# Patient Record
Sex: Female | Born: 1985 | Race: White | Hispanic: No | Smoking: Never smoker
Health system: Southern US, Community
[De-identification: ages and names within clinical notes are randomized; demographics above are authoritative.]

## PROBLEM LIST (undated history)

## (undated) ENCOUNTER — Inpatient Hospital Stay (HOSPITAL_COMMUNITY): Payer: Self-pay

## (undated) DIAGNOSIS — R5383 Other fatigue: Secondary | ICD-10-CM

## (undated) DIAGNOSIS — B009 Herpesviral infection, unspecified: Secondary | ICD-10-CM

## (undated) DIAGNOSIS — R519 Headache, unspecified: Secondary | ICD-10-CM

## (undated) DIAGNOSIS — F419 Anxiety disorder, unspecified: Secondary | ICD-10-CM

## (undated) DIAGNOSIS — J302 Other seasonal allergic rhinitis: Secondary | ICD-10-CM

## (undated) DIAGNOSIS — R51 Headache: Secondary | ICD-10-CM

## (undated) DIAGNOSIS — Z8619 Personal history of other infectious and parasitic diseases: Secondary | ICD-10-CM

## (undated) DIAGNOSIS — F329 Major depressive disorder, single episode, unspecified: Secondary | ICD-10-CM

## (undated) DIAGNOSIS — F32A Depression, unspecified: Secondary | ICD-10-CM

## (undated) DIAGNOSIS — G43909 Migraine, unspecified, not intractable, without status migrainosus: Secondary | ICD-10-CM

## (undated) DIAGNOSIS — E039 Hypothyroidism, unspecified: Secondary | ICD-10-CM

## (undated) HISTORY — DX: Anxiety disorder, unspecified: F41.9

## (undated) HISTORY — PX: UPPER GI ENDOSCOPY: SHX6162

## (undated) HISTORY — DX: Migraine, unspecified, not intractable, without status migrainosus: G43.909

## (undated) HISTORY — PX: EYE SURGERY: SHX253

## (undated) HISTORY — DX: Headache: R51

## (undated) HISTORY — DX: Headache, unspecified: R51.9

## (undated) HISTORY — DX: Other fatigue: R53.83

## (undated) HISTORY — PX: FLEXIBLE SIGMOIDOSCOPY: SHX1649

## (undated) HISTORY — DX: Personal history of other infectious and parasitic diseases: Z86.19

## (undated) HISTORY — DX: Major depressive disorder, single episode, unspecified: F32.9

## (undated) HISTORY — DX: Depression, unspecified: F32.A

---
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
## 2006-01-05 ENCOUNTER — Ambulatory Visit: Payer: Self-pay | Admitting: Family Medicine

## 2006-01-27 ENCOUNTER — Ambulatory Visit: Payer: Self-pay | Admitting: Sports Medicine

## 2006-03-02 ENCOUNTER — Ambulatory Visit: Payer: Self-pay | Admitting: Family Medicine

## 2006-03-19 ENCOUNTER — Ambulatory Visit: Payer: Self-pay | Admitting: Sports Medicine

## 2006-03-23 ENCOUNTER — Other Ambulatory Visit: Admission: RE | Admit: 2006-03-23 | Discharge: 2006-03-23 | Payer: Self-pay | Admitting: Gynecology

## 2006-06-17 ENCOUNTER — Ambulatory Visit: Payer: Self-pay | Admitting: Family Medicine

## 2007-03-10 DIAGNOSIS — IMO0001 Reserved for inherently not codable concepts without codable children: Secondary | ICD-10-CM

## 2007-04-22 ENCOUNTER — Encounter (INDEPENDENT_AMBULATORY_CARE_PROVIDER_SITE_OTHER): Payer: Self-pay | Admitting: *Deleted

## 2007-04-29 ENCOUNTER — Ambulatory Visit: Payer: Self-pay | Admitting: Family Medicine

## 2007-05-03 ENCOUNTER — Ambulatory Visit: Payer: Self-pay | Admitting: Internal Medicine

## 2007-05-14 ENCOUNTER — Ambulatory Visit: Payer: Self-pay | Admitting: Internal Medicine

## 2007-05-14 DIAGNOSIS — M1710 Unilateral primary osteoarthritis, unspecified knee: Secondary | ICD-10-CM | POA: Insufficient documentation

## 2007-05-14 LAB — CONVERTED CEMR LAB
Basophils Absolute: 0 10*3/uL (ref 0.0–0.1)
Eosinophils Absolute: 0.1 10*3/uL (ref 0.0–0.6)
MCHC: 34.8 g/dL (ref 30.0–36.0)
MCV: 86.6 fL (ref 78.0–100.0)
Monocytes Relative: 9.5 % (ref 3.0–11.0)
Neutro Abs: 5.8 10*3/uL (ref 1.4–7.7)
Platelets: 250 10*3/uL (ref 150–400)
RBC: 4.76 M/uL (ref 3.87–5.11)
TSH: 2.02 microintl units/mL (ref 0.35–5.50)
WBC: 9.1 10*3/uL (ref 4.5–10.5)

## 2007-06-14 ENCOUNTER — Ambulatory Visit: Payer: Self-pay | Admitting: Internal Medicine

## 2007-11-01 ENCOUNTER — Other Ambulatory Visit: Admission: RE | Admit: 2007-11-01 | Discharge: 2007-11-01 | Payer: Self-pay | Admitting: Obstetrics and Gynecology

## 2008-01-26 ENCOUNTER — Telehealth: Payer: Self-pay | Admitting: Internal Medicine

## 2008-01-26 ENCOUNTER — Ambulatory Visit: Payer: Self-pay | Admitting: Internal Medicine

## 2008-01-26 DIAGNOSIS — H1045 Other chronic allergic conjunctivitis: Secondary | ICD-10-CM | POA: Insufficient documentation

## 2008-04-20 ENCOUNTER — Telehealth: Payer: Self-pay | Admitting: Internal Medicine

## 2008-04-21 ENCOUNTER — Telehealth: Payer: Self-pay | Admitting: Internal Medicine

## 2008-05-01 ENCOUNTER — Ambulatory Visit: Payer: Self-pay | Admitting: Internal Medicine

## 2008-05-01 DIAGNOSIS — J0100 Acute maxillary sinusitis, unspecified: Secondary | ICD-10-CM

## 2008-05-01 DIAGNOSIS — R519 Headache, unspecified: Secondary | ICD-10-CM | POA: Insufficient documentation

## 2008-05-01 DIAGNOSIS — R51 Headache: Secondary | ICD-10-CM

## 2008-05-01 DIAGNOSIS — G43909 Migraine, unspecified, not intractable, without status migrainosus: Secondary | ICD-10-CM | POA: Insufficient documentation

## 2008-05-01 LAB — CONVERTED CEMR LAB
Basophils Absolute: 0.2 10*3/uL — ABNORMAL HIGH (ref 0.0–0.1)
Eosinophils Absolute: 0.1 10*3/uL (ref 0.0–0.7)
Eosinophils Relative: 1.7 % (ref 0.0–5.0)
Hemoglobin: 14.5 g/dL (ref 12.0–15.0)
Lymphocytes Relative: 35.7 % (ref 12.0–46.0)
Monocytes Absolute: 0.5 10*3/uL (ref 0.1–1.0)
Monocytes Relative: 8.2 % (ref 3.0–12.0)
Neutro Abs: 3.4 10*3/uL (ref 1.4–7.7)
WBC: 6.6 10*3/uL (ref 4.5–10.5)

## 2008-05-02 ENCOUNTER — Telehealth: Payer: Self-pay | Admitting: Internal Medicine

## 2008-05-08 ENCOUNTER — Telehealth: Payer: Self-pay | Admitting: Internal Medicine

## 2008-05-09 ENCOUNTER — Telehealth: Payer: Self-pay | Admitting: Internal Medicine

## 2008-05-11 ENCOUNTER — Ambulatory Visit: Payer: Self-pay | Admitting: Internal Medicine

## 2008-05-12 ENCOUNTER — Telehealth: Payer: Self-pay | Admitting: Internal Medicine

## 2008-05-12 ENCOUNTER — Emergency Department (HOSPITAL_COMMUNITY): Admission: EM | Admit: 2008-05-12 | Discharge: 2008-05-12 | Payer: Self-pay | Admitting: *Deleted

## 2008-05-16 ENCOUNTER — Telehealth: Payer: Self-pay | Admitting: Internal Medicine

## 2008-05-19 ENCOUNTER — Telehealth: Payer: Self-pay | Admitting: Internal Medicine

## 2008-05-31 ENCOUNTER — Encounter: Payer: Self-pay | Admitting: Internal Medicine

## 2008-06-07 ENCOUNTER — Ambulatory Visit: Payer: Self-pay | Admitting: Obstetrics and Gynecology

## 2008-06-19 ENCOUNTER — Ambulatory Visit: Payer: Self-pay | Admitting: Obstetrics and Gynecology

## 2008-07-27 ENCOUNTER — Telehealth: Payer: Self-pay | Admitting: Internal Medicine

## 2008-08-11 ENCOUNTER — Ambulatory Visit: Payer: Self-pay | Admitting: Women's Health

## 2008-10-19 ENCOUNTER — Ambulatory Visit: Payer: Self-pay | Admitting: Internal Medicine

## 2008-10-19 DIAGNOSIS — K5289 Other specified noninfective gastroenteritis and colitis: Secondary | ICD-10-CM | POA: Insufficient documentation

## 2008-11-13 ENCOUNTER — Telehealth: Payer: Self-pay | Admitting: Internal Medicine

## 2008-11-21 ENCOUNTER — Encounter: Payer: Self-pay | Admitting: Obstetrics and Gynecology

## 2008-11-21 ENCOUNTER — Ambulatory Visit: Payer: Self-pay | Admitting: Obstetrics and Gynecology

## 2008-11-21 ENCOUNTER — Other Ambulatory Visit: Admission: RE | Admit: 2008-11-21 | Discharge: 2008-11-21 | Payer: Self-pay | Admitting: Obstetrics and Gynecology

## 2008-11-23 ENCOUNTER — Inpatient Hospital Stay: Payer: Self-pay | Admitting: Psychiatry

## 2009-03-09 ENCOUNTER — Telehealth: Payer: Self-pay | Admitting: Internal Medicine

## 2009-03-09 ENCOUNTER — Ambulatory Visit: Payer: Self-pay | Admitting: Internal Medicine

## 2009-03-09 DIAGNOSIS — L255 Unspecified contact dermatitis due to plants, except food: Secondary | ICD-10-CM

## 2009-05-17 ENCOUNTER — Ambulatory Visit: Payer: Self-pay | Admitting: Obstetrics and Gynecology

## 2009-05-24 ENCOUNTER — Encounter: Admission: RE | Admit: 2009-05-24 | Discharge: 2009-05-24 | Payer: Self-pay | Admitting: Obstetrics and Gynecology

## 2009-05-24 ENCOUNTER — Ambulatory Visit: Payer: Self-pay | Admitting: Gynecology

## 2010-02-22 ENCOUNTER — Ambulatory Visit: Payer: Self-pay | Admitting: Obstetrics and Gynecology

## 2010-02-28 IMAGING — CR DG KNEE COMPLETE 4+V*L*
5 series · 5 of 5 positions shown · non-contrast
Comparison: None.

CLINICAL DATA: 21-year-old female knee pain

LEFT KNEE - COMPLETE 4+ VIEW

[t knee ap left]
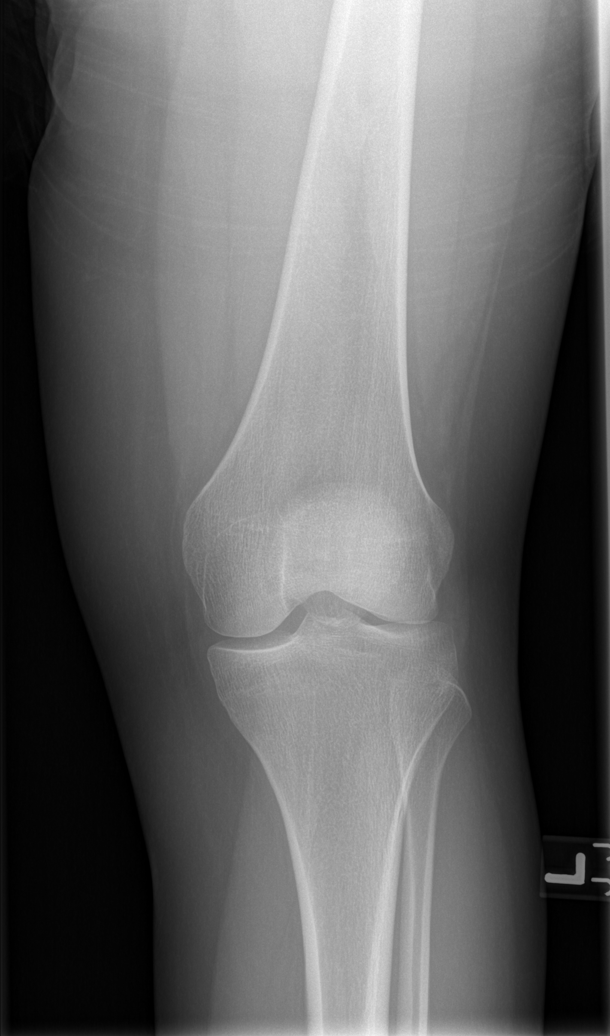

[t knee oblique left (1 of 2)]
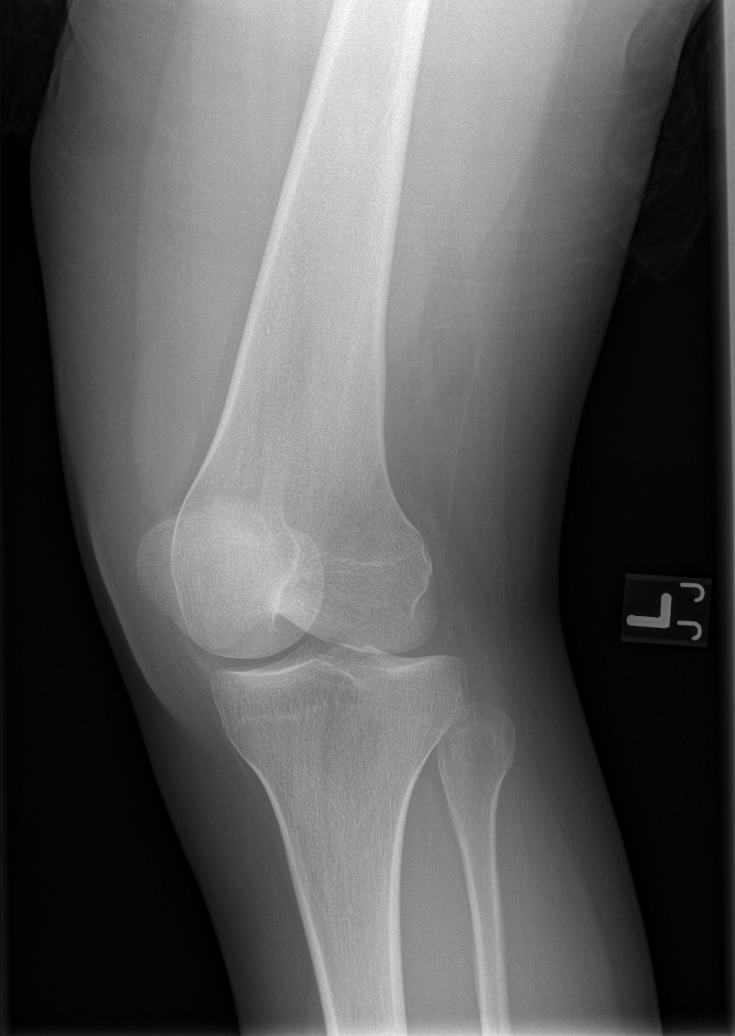

[t knee oblique left (2 of 2)]
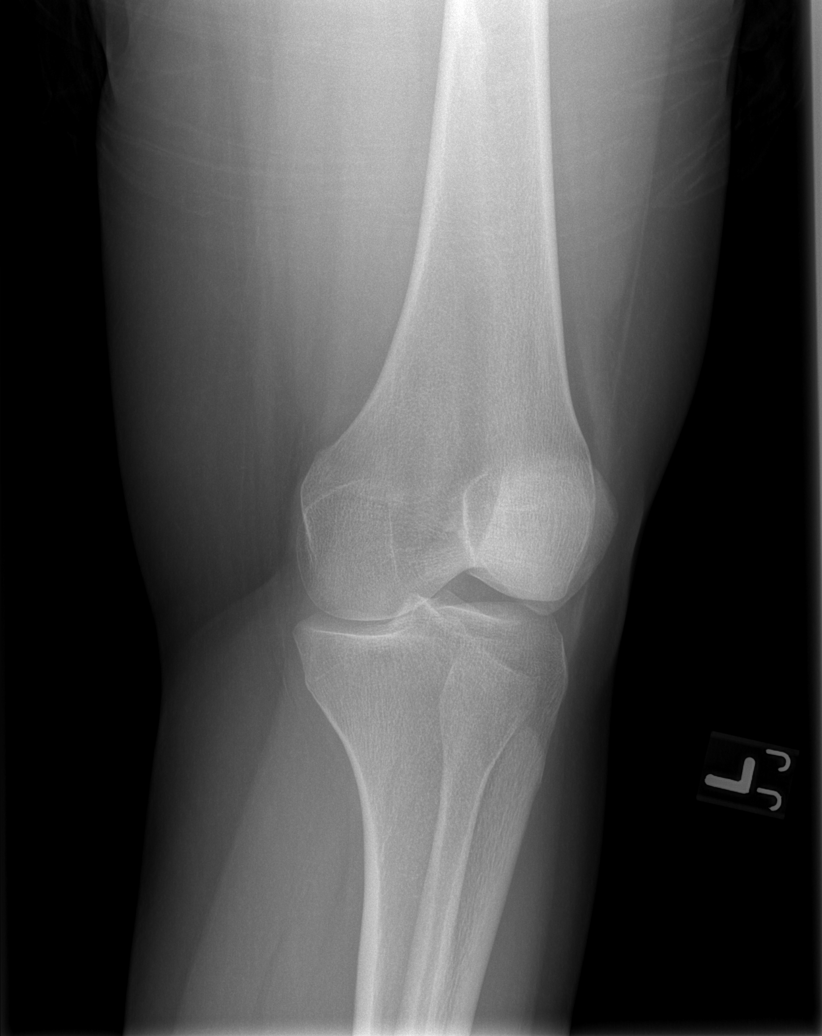

[t knee lat left (1 of 2)]
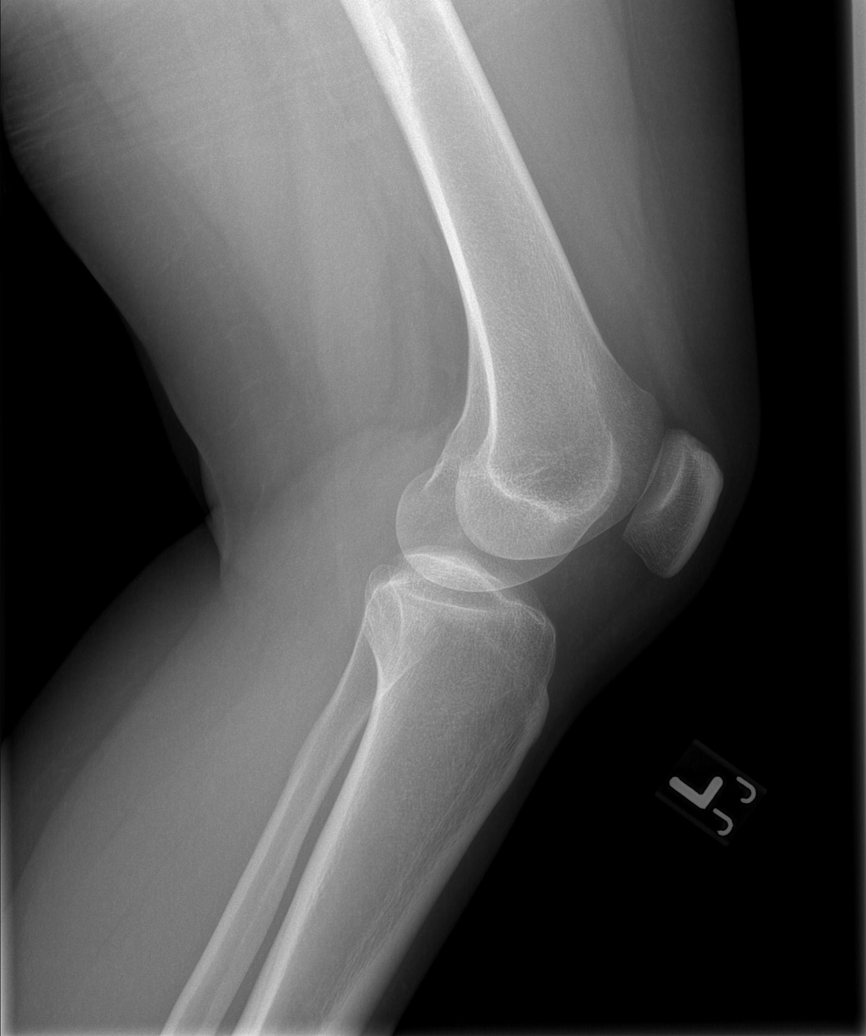

[t knee lat left (2 of 2)]
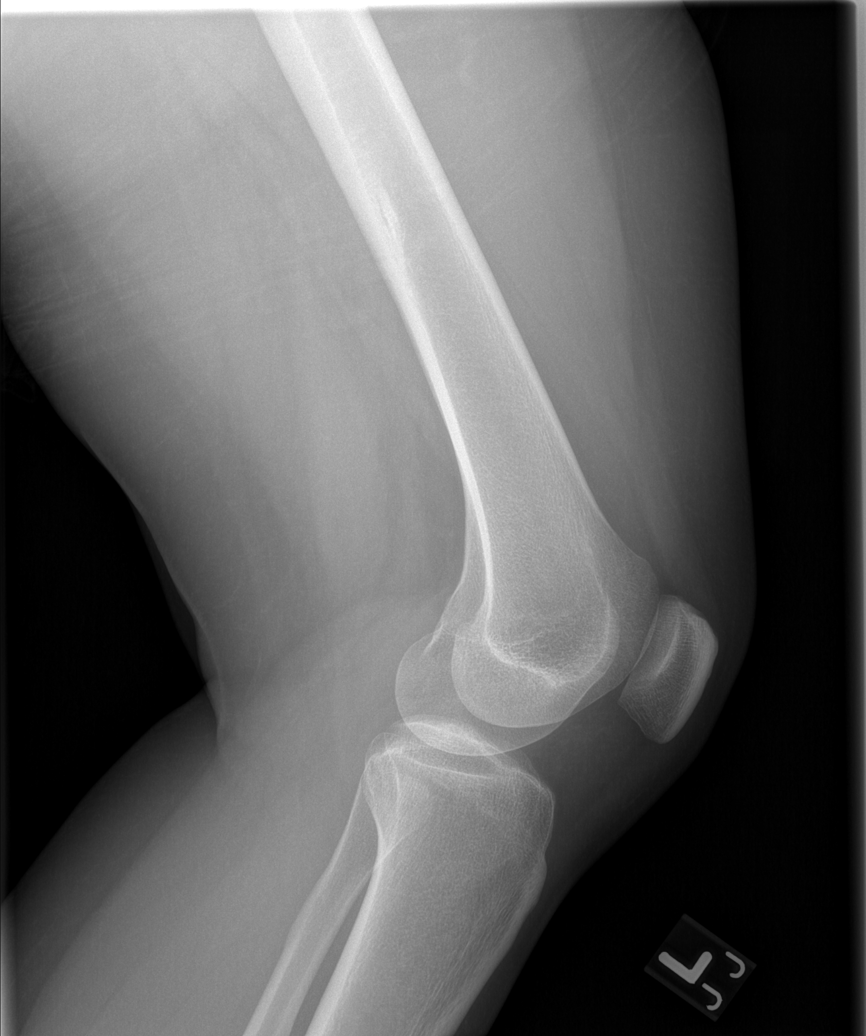

[5 of 5 positions shown; findings below may reference images not displayed]

FINDINGS: Normal alignment without fracture or joint effusion.
Preserved joint spaces.  No significant arthritic change.
IMPRESSION: No acute finding.

## 2010-08-27 ENCOUNTER — Other Ambulatory Visit: Admission: RE | Admit: 2010-08-27 | Discharge: 2010-08-27 | Payer: Self-pay | Admitting: Obstetrics and Gynecology

## 2010-08-27 ENCOUNTER — Ambulatory Visit: Payer: Self-pay | Admitting: Obstetrics and Gynecology

## 2011-06-27 LAB — CBC
MCHC: 33.5
Platelets: 245
RDW: 11.9

## 2011-06-27 LAB — DIFFERENTIAL
Basophils Relative: 1
Eosinophils Absolute: 0.1
Lymphocytes Relative: 28
Lymphs Abs: 2.1
Monocytes Relative: 4
Neutrophils Relative %: 66

## 2011-06-27 LAB — COMPREHENSIVE METABOLIC PANEL
BUN: 10
Creatinine, Ser: 0.85
Glucose, Bld: 89
Potassium: 4.1
Sodium: 138

## 2011-06-27 LAB — URINALYSIS, ROUTINE W REFLEX MICROSCOPIC
Bilirubin Urine: NEGATIVE
Hgb urine dipstick: NEGATIVE
Nitrite: NEGATIVE
Protein, ur: NEGATIVE
Specific Gravity, Urine: 1.015
Urobilinogen, UA: 0.2

## 2011-06-27 LAB — URINE CULTURE

## 2011-06-27 LAB — PROTIME-INR: Prothrombin Time: 12.9

## 2011-06-27 LAB — B. BURGDORFI ANTIBODIES: B burgdorferi Ab IgG+IgM: 0.16

## 2011-06-27 LAB — WET PREP, GENITAL

## 2011-06-27 LAB — APTT: aPTT: 21 — ABNORMAL LOW

## 2011-06-27 LAB — LYME DISEASE DNA BY PCR(BORRELIA BURG)

## 2011-06-27 LAB — GC/CHLAMYDIA PROBE AMP, GENITAL: Chlamydia, DNA Probe: NEGATIVE

## 2011-06-27 LAB — ROCKY MTN SPOTTED FVR AB, IGG-BLOOD: RMSF IgG: 0.56 IV

## 2011-06-27 LAB — SEDIMENTATION RATE: Sed Rate: 4

## 2011-06-27 LAB — RPR: RPR Ser Ql: NONREACTIVE

## 2011-07-07 ENCOUNTER — Ambulatory Visit (INDEPENDENT_AMBULATORY_CARE_PROVIDER_SITE_OTHER): Payer: 59 | Admitting: Family Medicine

## 2011-07-07 ENCOUNTER — Encounter: Payer: Self-pay | Admitting: Family Medicine

## 2011-07-07 VITALS — BP 104/68 | HR 73 | Temp 98.0°F | Wt 152.0 lb

## 2011-07-07 DIAGNOSIS — R42 Dizziness and giddiness: Secondary | ICD-10-CM

## 2011-07-07 DIAGNOSIS — R51 Headache: Secondary | ICD-10-CM

## 2011-07-07 LAB — CBC WITH DIFFERENTIAL/PLATELET
Basophils Absolute: 0 10*3/uL (ref 0.0–0.1)
Eosinophils Absolute: 0.1 10*3/uL (ref 0.0–0.7)
HCT: 45 % (ref 36.0–46.0)
Hemoglobin: 15.2 g/dL — ABNORMAL HIGH (ref 12.0–15.0)
Lymphs Abs: 2 10*3/uL (ref 0.7–4.0)
MCHC: 33.8 g/dL (ref 30.0–36.0)
Monocytes Absolute: 0.5 10*3/uL (ref 0.1–1.0)
Neutro Abs: 4 10*3/uL (ref 1.4–7.7)
Platelets: 210 10*3/uL (ref 150.0–400.0)
RDW: 13 % (ref 11.5–14.6)

## 2011-07-07 LAB — HEPATIC FUNCTION PANEL
ALT: 11 U/L (ref 0–35)
AST: 14 U/L (ref 0–37)
Bilirubin, Direct: 0 mg/dL (ref 0.0–0.3)
Total Bilirubin: 0.7 mg/dL (ref 0.3–1.2)
Total Protein: 7.9 g/dL (ref 6.0–8.3)

## 2011-07-07 LAB — POCT URINALYSIS DIPSTICK
Ketones, UA: NEGATIVE
Protein, UA: NEGATIVE
Spec Grav, UA: 1.015
Urobilinogen, UA: 0.2
pH, UA: 6.5

## 2011-07-07 LAB — BASIC METABOLIC PANEL
BUN: 12 mg/dL (ref 6–23)
CO2: 29 mEq/L (ref 19–32)
Chloride: 103 mEq/L (ref 96–112)
Creatinine, Ser: 0.9 mg/dL (ref 0.4–1.2)
Potassium: 4.9 mEq/L (ref 3.5–5.1)

## 2011-07-07 MED ORDER — SUMATRIPTAN SUCCINATE 100 MG PO TABS: 100.0000 mg | ORAL_TABLET | Freq: Once | ORAL | Status: DC | PRN

## 2011-07-07 MED ORDER — CYCLOBENZAPRINE HCL 10 MG PO TABS: 10.0000 mg | ORAL_TABLET | Freq: Three times a day (TID) | ORAL | Status: DC | PRN

## 2011-07-07 NOTE — Progress Notes (Signed)
  Subjective:    Patient ID: Ariana Spencer, female    DOB: December 11, 1985, 25 y.o.   MRN: [id number]  HPI Here with mother for several problems. First she has a hx of migraines, and these have been more frequent for several months. These are classic throbbing HAs with nausea. She usually takes Motrin for these. She has never tried a triptan. Also for 2 weeks she has had frequet spells of dizziness which she describes as the room spinning when she moves suddenly. Also she has a long hx of neck stiffness after an accident years ago, and this often makes her sleep difficult.    Review of Systems  Constitutional: Negative.   Respiratory: Negative.   Cardiovascular: Negative.   Neurological: Positive for dizziness.       Objective:   Physical Exam  Constitutional: She is oriented to person, place, and time. She appears well-developed and well-nourished.  Neck: Neck supple. No thyromegaly present.  Cardiovascular: Normal rate, regular rhythm, normal heart sounds and intact distal pulses.   Pulmonary/Chest: Effort normal and breath sounds normal.  Lymphadenopathy:    She has no cervical adenopathy.  Neurological: She is alert and oriented to person, place, and time. She has normal reflexes. No cranial nerve deficit. She exhibits normal muscle tone. Coordination normal.          Assessment & Plan:  She seems to have a mixture of tension HAs and migraines. For the migraines, try Sumatriptan. Her dizziness seems to be labyrinthine in origin, so she will try Claritin D. Get labs today

## 2011-07-10 ENCOUNTER — Telehealth: Payer: Self-pay | Admitting: Family Medicine

## 2011-07-10 NOTE — Telephone Encounter (Signed)
Spoke with pt and gave results. 

## 2011-07-10 NOTE — Telephone Encounter (Signed)
Message copied by Baldemar Friday on Thu Jul 10, 2011  4:02 PM ------      Message from: Gershon Crane A      Created: Tue Jul 08, 2011  6:00 AM       normal

## 2011-07-14 ENCOUNTER — Ambulatory Visit (INDEPENDENT_AMBULATORY_CARE_PROVIDER_SITE_OTHER): Payer: 59 | Admitting: Obstetrics and Gynecology

## 2011-07-14 ENCOUNTER — Encounter: Payer: Self-pay | Admitting: Obstetrics and Gynecology

## 2011-07-14 DIAGNOSIS — N644 Mastodynia: Secondary | ICD-10-CM

## 2011-07-14 DIAGNOSIS — B029 Zoster without complications: Secondary | ICD-10-CM | POA: Insufficient documentation

## 2011-07-14 NOTE — Progress Notes (Signed)
Patient came to see me today with a 7 day history of right breast pain and swelling. She notices significant asymmetry with the right breast much larger than her left breast. She says most of the discomfort is central.  Breast exam: Kennon Portela present. The patient's breasts were examined the sitting position. Her right breast is significantly larger than the left breast. There is a suggestion of a lump but I cannot actually define one. She is status post augmentation. She was then examined lying down. No dominant lesion can be felt in this position at all. She is very tender at her nipple.  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  Plan: Ultrasound of right breast with possible mammography. Vitamin D 400 mg daily. Continue to reduce caffeine. If ultrasound negative and tenderness persists she will call for danazol.

## 2011-07-16 ENCOUNTER — Telehealth: Payer: Self-pay | Admitting: *Deleted

## 2011-07-16 ENCOUNTER — Other Ambulatory Visit: Payer: Self-pay | Admitting: *Deleted

## 2011-07-16 DIAGNOSIS — N644 Mastodynia: Secondary | ICD-10-CM

## 2011-07-16 NOTE — Telephone Encounter (Signed)
Message copied by Libby Maw on Wed Jul 16, 2011  9:29 AM ------      Message from: Trellis Paganini      Created: Mon Jul 14, 2011 12:45 PM       Patient has swelling and pain in her right breast. Please schedule her for ultrasound of her right breast. Also schedule diagnostic mammogram right breast pending ultrasound. [facility]

## 2011-07-16 NOTE — Telephone Encounter (Signed)
Patient informed appointment set at the [facility] [place] on 07/21/11 @9 :30am.

## 2011-07-21 ENCOUNTER — Ambulatory Visit
Admission: RE | Admit: 2011-07-21 | Discharge: 2011-07-21 | Disposition: A | Discharge status: Home / Self Care | Payer: 59 | Source: Ambulatory Visit | Attending: Obstetrics and Gynecology | Admitting: Obstetrics and Gynecology

## 2011-07-21 ENCOUNTER — Telehealth: Payer: Self-pay | Admitting: *Deleted

## 2011-07-21 DIAGNOSIS — N644 Mastodynia: Secondary | ICD-10-CM

## 2011-07-21 NOTE — Telephone Encounter (Signed)
I think we should have her see one of the surgeons in [facility]. You can get her an appoinment with anyone. My favorites are Trixie Rude or [employer].

## 2011-07-21 NOTE — Telephone Encounter (Signed)
Patient's mother called today saying they had the right breast ultrasound done today and was neg.  Mother states she still has bumps that are visible and is in a lot of pain.  What else can be done at this point?

## 2011-07-22 NOTE — Telephone Encounter (Signed)
Lm to call

## 2011-07-23 NOTE — Telephone Encounter (Signed)
Patient's mother informed appointment set up with Dr. Ezzard Standing tomorrow at 3pm.

## 2011-07-24 ENCOUNTER — Ambulatory Visit (INDEPENDENT_AMBULATORY_CARE_PROVIDER_SITE_OTHER): Payer: 59 | Admitting: Surgery

## 2011-07-24 ENCOUNTER — Encounter (INDEPENDENT_AMBULATORY_CARE_PROVIDER_SITE_OTHER): Payer: Self-pay | Admitting: Surgery

## 2011-07-24 VITALS — BP 122/78 | HR 60 | Temp 98.1°F | Resp 20 | Ht 63.0 in | Wt 157.0 lb

## 2011-07-24 DIAGNOSIS — N644 Mastodynia: Secondary | ICD-10-CM

## 2011-07-24 NOTE — Progress Notes (Signed)
ASSESSMENT AND PLAN: 1.  Right breast mass/pain.  Non specific, under and around areola.  I do not think the pain is related to the implants.  I wonder whether this has something to do with the shingles she has had.  I can find no primary breast problem.  She could have some fibrocystic disease of the breast, but her [place] of her breast is not impressive.  I gave her literature on breast disease.  Suggested she see Dr. Shon Hough.  If shingles is a question, review this with Dr. Lovell Sheehan.  She can take NSAIDs or tylenol for the pain.  She is already taking ibuprofen.  See me back on a PRN basis.  2.  Bilateral implants,  [place], 2007  3.  Vertigo.  Coincides with breast trouble.  Etiology unknown.  4.  Shingles, right back, history of.  Chief Complaint  Patient presents with  . New Evaluation    painful knot in right breast    REFERRING PHYSICIAN: Dr. April Holding.  HISTORY OF PRESENT ILLNESS: Ariana Spencer is a 25 y.o. (DOB: Aug 21, 1986)  white female whose primary care physician is Carrie Mew, MD and comes to me today for right breast mass.  The patient developed soreness and swelling in her right breast about 3 weeks ago. She had a dull throbbing pain. She noticed a change in the size of her breast while in the shower. She has had no fever, no redness to her breasts, or no nipple discharge.  She had a negative [place] of her right breast [facility] the [facility] on [facility]/21/2011.  She did have bilateral breast implants by Dr. Louisa Second in 2007. She has been happy with her implants and has had no problems.  About the same time she developed the breast pain, she developed vertigo. She describes it as" morning sickness", but she is not pregnant.  Also in the mix, she has history of shingles to her right back. She has had some tingling sensation in the area of her old shingles.  Past Medical History  Diagnosis Date  . Herpes zoster 05/24/09  . IUD 06/19/08    HAS MIRENA IN  PLACE/INSERTED 06/19/08  . Nausea and vomiting   . Migraines   . Generalized headaches   . Fatigue     Past Surgical History  Procedure Date  . Augmentation mammaplasty 2007  . Wisdom tooth extraction 2007    Current Outpatient Prescriptions  Medication Sig Dispense Refill  . IBUPROFEN PO Take by mouth daily.          No Known Allergies  REVIEW OF SYSTEMS: Skin:  No history of rash.  No history of abnormal moles. Infection:  HIstory of shingles, right back.. Neurologic:  No history of headaches. Has vertigo that started about when this breast pain began - 3 weeks ago.  It is better, but still there. Cardiac:  No history of hypertension. No history of heart disease.   Pulmonary:  Does not smoke cigarettes.  No asthma or bronchitis.    Endocrine:  No diabetes. No thyroid disease. Gastrointestinal:  No history of stomach disease.  No history of liver disease.  No history of colon disease. Urologic:  No history of kidney stones.  No history of bladder infections. Musculoskeletal:  No history of joint or back disease. Hematologic:  No bleeding disorder.  Psycho-social:  The patient is oriented.   The patient has no obvious psychologic or social impairment to understanding our conversation and plan.  SOCIAL and  FAMILY HISTORY: Single, works as Film/video editor, Information systems manager.  Also coaches cheerleaders. Accompanied with grandmother and mother.  PHYSICAL EXAM: BP 122/78  Pulse 60  Temp(Src) 98.1 F (36.7 C) (Temporal)  Resp 20  Ht 5\' 3"  (1.6 m)  Wt 157 lb (71.215 kg)  BMI 27.81 kg/m2  General: WN WF, young,  who is alert and generally healthy appearing.  HEENT: Normal. Pupils equal. Good dentition. Neck: Supple. No mass.  No thyroid mass.  Carotid pulse okay with no bruit. Lymph Nodes:  No supraclavicular or cervical nodes. Lungs: Clear to auscultation and symmetric breath sounds. Heart:  RRR. No murmur or rub. Breasts:  Tender under right areola, but I feel no mass.  She has no  nipple discharge.  She points to areas on the right breast where she feels a mass at 3 o'clock and 7 o'clock about 2-3 cm off areola, but I fell no suspicious mass in those areas.  Inframammary scar from implants bilaterally.  No rash  Abdomen: Soft. Umbilical ring  No abdominal scars. Extremities:  Good strength and ROM  in upper and lower extremities. Neurologic:  Grossly intact to motor and sensory function. Psychiatric: Has normal mood and affect. Behavior is normal.   DATA REVIEWED: [place] report from breast center - 07/21/2011. I did an office [place].  I saw no mass or nodule.  The implant lining looks normal.  Her breast thickness is between 1.2 - 1.4 cm.  Ovidio Kin, M.D., [facility], [place] [phone]

## 2011-07-24 NOTE — Patient Instructions (Signed)
1.  See Dr. Shon Hough for eval.  2.  Anti-inflammatory drugs may help.

## 2011-10-03 ENCOUNTER — Emergency Department (HOSPITAL_COMMUNITY): Payer: 59

## 2011-10-03 ENCOUNTER — Emergency Department (HOSPITAL_COMMUNITY)
Admission: EM | Admit: 2011-10-03 | Discharge: 2011-10-03 | Disposition: A | Discharge status: Home / Self Care | Payer: 59 | Attending: Emergency Medicine | Admitting: Emergency Medicine

## 2011-10-03 ENCOUNTER — Telehealth: Payer: Self-pay | Admitting: *Deleted

## 2011-10-03 ENCOUNTER — Encounter (HOSPITAL_COMMUNITY): Payer: Self-pay

## 2011-10-03 ENCOUNTER — Other Ambulatory Visit: Payer: Self-pay

## 2011-10-03 DIAGNOSIS — N949 Unspecified condition associated with female genital organs and menstrual cycle: Secondary | ICD-10-CM | POA: Insufficient documentation

## 2011-10-03 DIAGNOSIS — R0602 Shortness of breath: Secondary | ICD-10-CM | POA: Insufficient documentation

## 2011-10-03 DIAGNOSIS — R109 Unspecified abdominal pain: Secondary | ICD-10-CM | POA: Insufficient documentation

## 2011-10-03 DIAGNOSIS — R10819 Abdominal tenderness, unspecified site: Secondary | ICD-10-CM | POA: Insufficient documentation

## 2011-10-03 LAB — CBC
HCT: 44 % (ref 36.0–46.0)
MCHC: 34.5 g/dL (ref 30.0–36.0)
Platelets: 214 10*3/uL (ref 150–400)
RDW: 12.2 % (ref 11.5–15.5)

## 2011-10-03 LAB — URINALYSIS, ROUTINE W REFLEX MICROSCOPIC
Leukocytes, UA: NEGATIVE
Nitrite: NEGATIVE
Protein, ur: NEGATIVE mg/dL
Urobilinogen, UA: 0.2 mg/dL (ref 0.0–1.0)

## 2011-10-03 LAB — D-DIMER, QUANTITATIVE: D-Dimer, Quant: <0.22 ug/mL-FEU (ref 0.00–0.48)

## 2011-10-03 LAB — BASIC METABOLIC PANEL
Chloride: 95 mEq/L — ABNORMAL LOW (ref 96–112)
Creatinine, Ser: 0.74 mg/dL (ref 0.50–1.10)
GFR calc Af Amer: >90 mL/min (ref >90)
Sodium: 134 mEq/L — ABNORMAL LOW (ref 135–145)

## 2011-10-03 LAB — DIFFERENTIAL
Basophils Absolute: 0 10*3/uL (ref 0.0–0.1)
Basophils Relative: 0 % (ref 0–1)
Monocytes Absolute: 0.7 10*3/uL (ref 0.1–1.0)
Neutro Abs: 7.5 10*3/uL (ref 1.7–7.7)
Neutrophils Relative %: 72 % (ref 43–77)

## 2011-10-03 LAB — GLUCOSE, CAPILLARY: Glucose-Capillary: 60 mg/dL — ABNORMAL LOW (ref 70–99)

## 2011-10-03 MED ORDER — IOHEXOL 300 MG/ML  SOLN
20.0000 mL | INTRAMUSCULAR | Status: AC
  Administered 2011-10-03: 20 mL via ORAL

## 2011-10-03 MED ORDER — SODIUM CHLORIDE 0.9 % IV BOLUS (SEPSIS)
1000.0000 mL | Freq: Once | INTRAVENOUS | Status: AC
  Administered 2011-10-03: 1000 mL via INTRAVENOUS

## 2011-10-03 MED ORDER — MORPHINE SULFATE 4 MG/ML IJ SOLN
4.0000 mg | Freq: Once | INTRAMUSCULAR | Status: AC
  Administered 2011-10-03: 4 mg via INTRAVENOUS
  Filled 2011-10-03: qty 1

## 2011-10-03 MED ORDER — PROMETHAZINE HCL 25 MG PO TABS: 25.0000 mg | ORAL_TABLET | Freq: Four times a day (QID) | ORAL | Status: AC | PRN

## 2011-10-03 MED ORDER — OXYCODONE-ACETAMINOPHEN 5-325 MG PO TABS: 1.0000 | ORAL_TABLET | ORAL | Status: AC | PRN

## 2011-10-03 MED ORDER — IOHEXOL 300 MG/ML  SOLN
80.0000 mL | Freq: Once | INTRAMUSCULAR | Status: AC | PRN
  Administered 2011-10-03: 80 mL via INTRAVENOUS

## 2011-10-03 NOTE — ED Provider Notes (Signed)
Medical screening examination/treatment/procedure(s) were conducted as a shared visit with non-physician practitioner(s) and myself.  I personally evaluated the patient during the encounter 26 year old, female, with no significant past medical history was at work doing paperwork when she had sudden onset of abdominal pain with mild difficulty breathing.  She has never had this before.  The abdominal pain radiates up to her left shoulder.  She denies a history of recent fevers, sore throat, runny nose, or rash.  She has had ovarian cysts in the past, but no endometriosis.  She has an IUD.  On examination.  She has very shallow gasping respirations, but is not in distress.  She has diffuse abdominal tenderness, most prominently in the epigastrium and left upper quadrant.  She does not have any guarding.  Bedside fast exam does not show any free fluid.  We will perform a chest x-ray looking for free air, as well as a d-dimer for pulmonary embolism, which I doubt.  We'll also perform a CAT scan of her abdomen.  Since that is where the predominance of her symptoms are  Nicholes Stairs, MD 10/03/11 [phone]

## 2011-10-03 NOTE — ED Notes (Signed)
Notified CT that pt is finished with po contrast  

## 2011-10-03 NOTE — ED Provider Notes (Signed)
History     CSN: [id number]  Arrival date & time 10/03/11  1356   First MD Initiated Contact with Patient 10/03/11 1708      Chief Complaint  Patient presents with  . Abdominal Pain   HPI Patient was seen at 1715 with Dr. Weldon Inches.  Patient presents to the emergency room with complaint of sudden onset periumbilical/epigastric abdominal pain that started this morning while she was sitting at her desk at work. Patient denies any nausea. Reports associated shortness of breath. Denies any trauma or injury. Denies any recent travel or surgeries. Denies any other concerns at this time.   Past Medical History  Diagnosis Date  . Herpes zoster 05/24/09  . IUD 06/19/08    HAS MIRENA IN PLACE/INSERTED 06/19/08  . Nausea and vomiting   . Migraines   . Generalized headaches   . Fatigue     Past Surgical History  Procedure Date  . Augmentation mammaplasty 2007  . Wisdom tooth extraction 2007    Family History  Problem Relation Age of Onset  . Hypertension Father   . Cancer Maternal Grandmother     ovarian or uterine cancer and throat cancer  . Breast cancer Maternal Grandmother 29  . Cancer Paternal Grandfather     lung  . Lymphoma Paternal Grandfather     History  Substance Use Topics  . Smoking status: Never Smoker   . Smokeless tobacco: Never Used  . Alcohol Use: 0.0 oz/week    1-2 Glasses of wine per week     occas    OB History    Grav Para Term Preterm Abortions TAB SAB Ect Mult Living   0               Review of Systems  Constitutional: Negative for fever, chills, diaphoresis, activity change, appetite change, fatigue and unexpected weight change.  HENT: Negative for hearing loss, ear pain, nosebleeds, congestion, sore throat, trouble swallowing, neck pain, neck stiffness, voice change, tinnitus and ear discharge.   Eyes: Negative for photophobia and visual disturbance.  Respiratory: Positive for shortness of breath. Negative for apnea, cough, choking, chest  tightness, wheezing and stridor.   Cardiovascular: Negative for chest pain.  Gastrointestinal: Positive for abdominal pain. Negative for nausea and vomiting.  Genitourinary: Negative for dysuria, flank pain, decreased urine volume, vaginal bleeding, vaginal discharge, difficulty urinating and vaginal pain.  Musculoskeletal: Negative for back pain.  Skin: Negative for rash.  Neurological: Negative for dizziness, facial asymmetry, weakness, light-headedness, numbness and headaches.  Psychiatric/Behavioral: Negative for behavioral problems and agitation.  All other systems reviewed and are negative.    Allergies  Review of patient's allergies indicates no known allergies.  Home Medications   Current Outpatient Rx  Name Route Sig Dispense Refill  . IBUPROFEN 200 MG PO TABS Oral Take 400 mg by mouth every 6 (six) hours as needed. For pain       BP 103/82  Pulse 83  Temp(Src) 98 F (36.7 C) (Oral)  Resp 25  SpO2 100%  Physical Exam  Nursing note and vitals reviewed. Constitutional: She is oriented to person, place, and time. She appears well-developed and well-nourished.  Non-toxic appearance. She appears distressed.       Vital signs stable  HENT:  Head: Normocephalic and atraumatic.  Right Ear: External ear normal.  Left Ear: External ear normal.  Mouth/Throat: No oropharyngeal exudate.  Eyes: EOM are normal. Pupils are equal, round, and reactive to light. Right eye exhibits no discharge. Left  eye exhibits no discharge.  Neck: Normal range of motion. Neck supple. No JVD present. No tracheal deviation present.  Cardiovascular: Normal rate, regular rhythm, normal heart sounds and intact distal pulses.  Exam reveals no gallop and no friction rub.   No murmur heard. Pulmonary/Chest: Effort normal and breath sounds normal. No stridor. No respiratory distress. She has no wheezes.  Abdominal: Soft. Bowel sounds are normal. She exhibits no mass. There is tenderness. There is no rebound  and no guarding.       LUQ, epigastric abdominal pain to palpation. Diffuse pain.   Musculoskeletal: Normal range of motion. She exhibits no edema and no tenderness.  Neurological: She is alert and oriented to person, place, and time. She exhibits normal muscle tone.  Skin: Skin is warm and dry. No rash noted. She is not diaphoretic.  Psychiatric: She has a normal mood and affect. Her behavior is normal. Judgment and thought content normal.    ED Course  Procedures (including critical care time)  Patient seen and evaluated.  VSS reviewed. . Nursing notes reviewed. Discussed with and seen with dr. Weldon Inches, attending physician. Initial testing ordered. Will monitor the patient closely. They agree with the treatment plan and diagnosis. Discussed care plan and case with him extensively.  Results for orders placed during the hospital encounter of 10/03/11  CBC      Component Value Range   WBC 10.5  4.0 - 10.5 (K/uL)   RBC 5.04  3.87 - 5.11 (MIL/uL)   Hemoglobin 15.2 (*) 12.0 - 15.0 (g/dL)   HCT 16.1  09.6 - 04.5 (%)   MCV 87.3  78.0 - 100.0 (fL)   MCH 30.2  26.0 - 34.0 (pg)   MCHC 34.5  30.0 - 36.0 (g/dL)   RDW 40.9  81.1 - 91.4 (%)   Platelets 214  150 - 400 (K/uL)  DIFFERENTIAL      Component Value Range   Neutrophils Relative 72  43 - 77 (%)   Neutro Abs 7.5  1.7 - 7.7 (K/uL)   Lymphocytes Relative 21  12 - 46 (%)   Lymphs Abs 2.2  0.7 - 4.0 (K/uL)   Monocytes Relative 7  3 - 12 (%)   Monocytes Absolute 0.7  0.1 - 1.0 (K/uL)   Eosinophils Relative 0  0 - 5 (%)   Eosinophils Absolute 0.0  0.0 - 0.7 (K/uL)   Basophils Relative 0  0 - 1 (%)   Basophils Absolute 0.0  0.0 - 0.1 (K/uL)  BASIC METABOLIC PANEL      Component Value Range   Sodium 134 (*) 135 - 145 (mEq/L)   Potassium 3.8  3.5 - 5.1 (mEq/L)   Chloride 95 (*) 96 - 112 (mEq/L)   CO2 21  19 - 32 (mEq/L)   Glucose, Bld 66 (*) 70 - 99 (mg/dL)   BUN 18  6 - 23 (mg/dL)   Creatinine, Ser 7.82  0.50 - 1.10 (mg/dL)    Calcium 9.6  8.4 - 10.5 (mg/dL)   GFR calc non Af Amer >90  >90 (mL/min)   GFR calc Af Amer >90  >90 (mL/min)  D-DIMER, QUANTITATIVE      Component Value Range   D-Dimer, Quant <0.22  0.00 - 0.48 (ug/mL-FEU)  POCT PREGNANCY, URINE      Component Value Range   Preg Test, Ur NEGATIVE    URINALYSIS, ROUTINE W REFLEX MICROSCOPIC      Component Value Range   Color, Urine YELLOW  YELLOW  APPearance CLEAR  CLEAR    Specific Gravity, Urine 1.031 (*) 1.005 - 1.030    pH 5.5  5.0 - 8.0    Glucose, UA NEGATIVE  NEGATIVE (mg/dL)   Hgb urine dipstick NEGATIVE  NEGATIVE    Bilirubin Urine NEGATIVE  NEGATIVE    Ketones, ur 40 (*) NEGATIVE (mg/dL)   Protein, ur NEGATIVE  NEGATIVE (mg/dL)   Urobilinogen, UA 0.2  0.0 - 1.0 (mg/dL)   Nitrite NEGATIVE  NEGATIVE    Leukocytes, UA NEGATIVE  NEGATIVE   GLUCOSE, CAPILLARY      Component Value Range   Glucose-Capillary 60 (*) 70 - 99 (mg/dL)   Comment 1 Notify RN     Comment 2 Documented in Chart    GLUCOSE, CAPILLARY      Component Value Range   Glucose-Capillary 106 (*) 70 - 99 (mg/dL)   Dg Chest 1 View  4/0/9811  *RADIOLOGY REPORT*  Clinical Data: Chest/abdominal pain  CHEST - 1 VIEW  Comparison: None.  Findings: Lungs are clear.  No pleural effusion or pneumothorax.  Cardiomediastinal silhouette is within normal limits.  IMPRESSION: No evidence of acute cardiopulmonary disease.  Original Report Authenticated By: Charline Bills, M.D.   US Transvaginal Non-ob  10/03/2011  *RADIOLOGY REPORT*  Clinical Data:  Pelvic pain and pressure.  Clinical concern for ovarian torsion.  TRANSABDOMINAL AND TRANSVAGINAL ULTRASOUND OF PELVIS DOPPLER ULTRASOUND OF OVARIES  Technique:  Both transabdominal and transvaginal ultrasound examinations of the pelvis were performed. Transabdominal technique was performed for global imaging of the pelvis including uterus, ovaries, adnexal regions, and pelvic cul-de-sac.  It was necessary to proceed with endovaginal exam  following the transabdominal exam to visualize the uterus, endometrium and ovaries in better detail.  Color and duplex Doppler ultrasound was utilized to evaluate blood flow to the ovaries.  Comparison:  Pelvic CT obtained earlier today.  Findings:  Uterus:  Normal in size and appearance  Endometrium:  Artifact from the patient's intrauterine device.  The visualized endometrium appears normal, measuring 3.1 mm in maximum thickness.  Right ovary: Normal appearance/no adnexal mass  Left ovary:   Normal appearance/no adnexal mass  Pulsed Doppler evaluation demonstrates normal low-resistance arterial and venous waveforms in both ovaries.  IMPRESSION: Normal exam.  No evidence of pelvic mass or other significant abnormality.  No sonographic evidence for ovarian torsion.  Original Report Authenticated By: Darrol Angel, M.D.   US Pelvis Complete  10/03/2011  *RADIOLOGY REPORT*  Clinical Data:  Pelvic pain and pressure.  Clinical concern for ovarian torsion.  TRANSABDOMINAL AND TRANSVAGINAL ULTRASOUND OF PELVIS DOPPLER ULTRASOUND OF OVARIES  Technique:  Both transabdominal and transvaginal ultrasound examinations of the pelvis were performed. Transabdominal technique was performed for global imaging of the pelvis including uterus, ovaries, adnexal regions, and pelvic cul-de-sac.  It was necessary to proceed with endovaginal exam following the transabdominal exam to visualize the uterus, endometrium and ovaries in better detail.  Color and duplex Doppler ultrasound was utilized to evaluate blood flow to the ovaries.  Comparison:  Pelvic CT obtained earlier today.  Findings:  Uterus:  Normal in size and appearance  Endometrium:  Artifact from the patient's intrauterine device.  The visualized endometrium appears normal, measuring 3.1 mm in maximum thickness.  Right ovary: Normal appearance/no adnexal mass  Left ovary:   Normal appearance/no adnexal mass  Pulsed Doppler evaluation demonstrates normal low-resistance  arterial and venous waveforms in both ovaries.  IMPRESSION: Normal exam.  No evidence of pelvic mass or other significant abnormality.  No sonographic evidence for ovarian torsion.  Original Report Authenticated By: Darrol Angel, M.D.   Ct Abdomen Pelvis W Contrast  10/03/2011  *RADIOLOGY REPORT*  Clinical Data: Left upper abdominal pain  CT ABDOMEN AND PELVIS WITH CONTRAST  Technique:  Multidetector CT imaging of the abdomen and pelvis was performed following the standard protocol during bolus administration of intravenous contrast.  Contrast: 20mL OMNIPAQUE IOHEXOL 300 MG/ML IV SOLN, 80mL OMNIPAQUE IOHEXOL 300 MG/ML IV SOLN  Comparison: None.  Findings: Evaluation of the upper abdomen is constrained by respiratory motion during image acquisition.  Lung bases are clear.  Bilateral breast augmentation.  Liver, spleen, pancreas, and adrenal glands are grossly unremarkable.  Gallbladder is unremarkable.  No intrahepatic or extrahepatic ductal dilatation.  Kidneys are unremarkable.  No hydronephrosis.  No evidence of bowel obstruction.  Normal appendix.  No evidence of abdominal aortic aneurysm.  No abdominopelvic ascites.  No suspicious abdominopelvic lymphadenopathy.  Uterus is notable for an IUD in satisfactory position.  Ovaries are unremarkable.  Bladder is within normal limits.  Visualized osseous structures are within normal limits.  IMPRESSION: Evaluation of the upper abdomen is constrained by respiratory motion during image acquisition.  IUD in satisfactory position.  Otherwise unremarkable CT abdomen pelvis.  Original Report Authenticated By: Charline Bills, M.D.   [place] Art/ven Flow Abd Pelv Doppler  10/03/2011  *RADIOLOGY REPORT*  Clinical Data:  Pelvic pain and pressure.  Clinical concern for ovarian torsion.  TRANSABDOMINAL AND TRANSVAGINAL ULTRASOUND OF PELVIS DOPPLER ULTRASOUND OF OVARIES  Technique:  Both transabdominal and transvaginal ultrasound examinations of the pelvis were performed.  Transabdominal technique was performed for global imaging of the pelvis including uterus, ovaries, adnexal regions, and pelvic cul-de-sac.  It was necessary to proceed with endovaginal exam following the transabdominal exam to visualize the uterus, endometrium and ovaries in better detail.  Color and duplex Doppler ultrasound was utilized to evaluate blood flow to the ovaries.  Comparison:  Pelvic CT obtained earlier today.  Findings:  Uterus:  Normal in size and appearance  Endometrium:  Artifact from the patient's intrauterine device.  The visualized endometrium appears normal, measuring 3.1 mm in maximum thickness.  Right ovary: Normal appearance/no adnexal mass  Left ovary:   Normal appearance/no adnexal mass  Pulsed Doppler evaluation demonstrates normal low-resistance arterial and venous waveforms in both ovaries.  IMPRESSION: Normal exam.  No evidence of pelvic mass or other significant abnormality.  No sonographic evidence for ovarian torsion.  Original Report Authenticated By: Darrol Angel, M.D.    Patient seen and re-evaluated. Resting comfortably. VSS stable. NAD. Patient notified of testing results. Stated agreement and understanding. Patient stated understanding to treatment plan and diagnosis.   11:12 PM discussed findings and seen with dr. Bebe Shaggy. Advised ekg and then send home if normal. EKG normal, advised that we should send her home. Discussed warning signs with patient to return. Stated agreement and understanding. No further workup needed at this time but advised of this to return. Discussed EKG and shown to Dr. Bebe Shaggy. No signs of pericarditis.    Date: 10/03/2011  Rate: 75  Rhythm: normal sinus rhythm  QRS Axis: right  Intervals: PR shortened  ST/T Wave abnormalities: normal  Conduction Disutrbances:none  Narrative Interpretation:   Old EKG Reviewed: none available   MDM  Abdominal pain, negative CT/ultrasound. No lab abnormalities. Pain improved. Sent home with  warning signs and referral to the gastroenterologist.  Shortness of breath, no PE, pneumothorax, pericarditis, PERC negative, pneumonia.  Demetrius Charity, PA 10/03/11 2316

## 2011-10-03 NOTE — ED Provider Notes (Signed)
Pt seen with PA She has had extensive workup, which is negative thus far Pt stable, awake/alert, no distress EKG reviewed BP 114/65  Pulse 83  Temp(Src) 98 F (36.7 C) (Oral)  Resp 19  SpO2 100%   Joya Gaskins, MD 10/03/11 2337

## 2011-10-03 NOTE — Telephone Encounter (Signed)
Another person called for pt stating pt is having pain from bra line down to panty line- not in chest- some sob -talked with dr Lovell Sheehan and he suggested to go to urgent care where they can do xrays, suggested [facility]-

## 2011-10-03 NOTE — ED Notes (Addendum)
Pt here with short gasping breaths states pain is in her abd.. Denies nausea and vomiting.states it comes and goes but took her breath in the beginning. States she is more comfortable at present.

## 2011-10-03 NOTE — ED Notes (Signed)
Patient  States that the onset of her pain was at 1100 this AM when she stood up Paient is in her epigastric area and in the center of her chest. States that epigastric pain is better when she holds pressure.  She is breathing in short gasping respirations. Chest is non tender to palpation.

## 2011-10-03 NOTE — ED Notes (Signed)
Pt presents with sudden onset of umbilical pain that radiates into epigastric area once she stood up from her desk this morning.  Pt reports nausea and diaphoresis at onset, walked outside with onset of shortness of breath and pain that radiated into her chest.  Pt reports pain now radiates into L axilla, pt describes like coming in waves.  -nausea.

## 2011-10-04 NOTE — ED Provider Notes (Signed)
Medical screening examination/treatment/procedure(s) were conducted as a shared visit with non-physician practitioner(s) and myself.  I personally evaluated the patient during the encounter   Joya Gaskins, MD 10/04/11 [phone]

## 2011-10-06 ENCOUNTER — Telehealth: Payer: Self-pay | Admitting: Internal Medicine

## 2011-10-06 ENCOUNTER — Encounter (INDEPENDENT_AMBULATORY_CARE_PROVIDER_SITE_OTHER): Payer: Self-pay | Admitting: Surgery

## 2011-10-06 DIAGNOSIS — R109 Unspecified abdominal pain: Secondary | ICD-10-CM

## 2011-10-06 NOTE — Telephone Encounter (Signed)
Per dr Lovell Sheehan- need gi referral per er records-- referral to [facility]

## 2011-10-06 NOTE — Telephone Encounter (Signed)
Pt was in ER re: severe chest and abdominal pain, sob. Pt needs a fup this wk with pcp. Pls advise.

## 2011-10-06 NOTE — Telephone Encounter (Signed)
thanks

## 2011-10-07 ENCOUNTER — Encounter: Payer: Self-pay | Admitting: Gastroenterology

## 2011-10-09 ENCOUNTER — Encounter: Payer: Self-pay | Admitting: *Deleted

## 2011-10-14 ENCOUNTER — Encounter: Payer: Self-pay | Admitting: Gastroenterology

## 2011-10-14 ENCOUNTER — Ambulatory Visit (INDEPENDENT_AMBULATORY_CARE_PROVIDER_SITE_OTHER): Payer: 59 | Admitting: Gastroenterology

## 2011-10-14 DIAGNOSIS — R109 Unspecified abdominal pain: Secondary | ICD-10-CM

## 2011-10-14 DIAGNOSIS — R112 Nausea with vomiting, unspecified: Secondary | ICD-10-CM

## 2011-10-14 DIAGNOSIS — R197 Diarrhea, unspecified: Secondary | ICD-10-CM

## 2011-10-14 DIAGNOSIS — K219 Gastro-esophageal reflux disease without esophagitis: Secondary | ICD-10-CM

## 2011-10-14 MED ORDER — RABEPRAZOLE SODIUM 20 MG PO TBEC: 20.0000 mg | DELAYED_RELEASE_TABLET | Freq: Every day | ORAL | Status: DC

## 2011-10-14 NOTE — Progress Notes (Addendum)
History of Present Illness:  This is a very nice 26 year old female patient of Dr. Darryll Capers. She currently gives a 10 day history of a sudden onset of rather severe subxiphoid pain without radiation that required emergency room visit where she had a thorough workup including CT scan of the abdomen, pelvic ultrasound, and multiple lab tests that were normal. Associated with her rather severe sharp subxiphoid pain has been nausea and vomiting and persistent diarrhea without fever chills, skin rashes, joint pains, oral stomatitis, rubs or systemic symptoms. She is pretty much on a clear liquid diet at this time. She denies infectious disease exposure, sick family members at home, exposure to children in diapers, or any history of chronic gastrointestinal illnesses. Family history also is non-contributory.  Patient has no history of food toxin exposure. She has watery diarrhea with almost any food but denies melena or hematochezia. She also complains of night sweats, low-grade fever, fatigue, associated urticaria, and a 6-8 pound weight loss. Review of lab data from the emergency room shows no abnormalities with a normal CBC, CRP, and liver profile. The patient denies abuse of alcohol, cigarettes, but does use Advil frequently. She has not had previous barium studies of her intestines, endoscopy or colonoscopy.  I have reviewed this patient's present history, medical and surgical past history, allergies and medications.     ROS:... The remainder of the 10 point ROS is negative... he did have chronic anxiety and depression, is not on therapy at this time. She also complains of frequent headaches, arthralgias and myalgias.     Physical Exam: General well developed well nourished patient in no acute distress, appearing her stated age Eyes PERRLA, no icterus, fundoscopic exam per opthamologist Skin no lesions noted Neck supple, no adenopathy, no thyroid enlargement, no tenderness Chest clear to  percussion and auscultation Heart no significant murmurs, gallops or rubs noted Abdomen no hepatosplenomegaly masses or tenderness, BS normal.  Rectal inspection normal no fissures, or fistulae noted.  No masses or tenderness on digital exam. Stool guaiac negative. There is tenderness in the left lower quadrant and over the descending colon area and in the epigastric area without rebound tenderness. Bowel sounds are normal.; Extremities no acute joint lesions, edema, phlebitis or evidence of cellulitis. Neurologic patient oriented x 3, cranial nerves intact, no focal neurologic deficits noted. Psychological mental status normal and normal affect.  Assessment and plan: 2 weeks of constant epigastric pain with associated nausea and vomiting and diarrhea. This may all be from a viral syndrome, ?? Darlina Guys, acute H. pylori infection or NSAID damage. I've scheduled endoscopy and flexible sigmoidoscopy exams tomorrow, have placed from AcipHex 20 mg twice a day, and will continue a soft, low fiber diet for now. Lipase level in the emergency room was normal. At the time of her exams we will obtain biopsies for H. pylori and to exclude celiac disease or other acute processes. She is to continue to avoid NSAIDs, alcohol, and other OTC medications. Other possibilities are the acute onset of inflammatory bowel disease.  Encounter Diagnoses  Name Primary?  . Diarrhea   . Abdominal pain

## 2011-10-14 NOTE — Patient Instructions (Signed)
Your procedure has been scheduled for 10/15/2011, please follow the seperate instructions.  Take Aciphex samples one tablet 30 min before breakfast.  Follow the low fiber diet.   Low Fiber and Residue Restricted Diet A low fiber diet restricts foods that contain carbohydrates that are not digested in the small intestine. A diet containing about 10 g of fiber is considered low fiber. The diet needs to be individualized to suit patient tolerances and preferences and to avoid unnecessary restrictions. Generally, the foods emphasized in a low fiber diet have no skins or seeds. They may have been processed to remove bran, germ, or husks. Cooking may not necessarily eliminate the fiber. Cooking may, in fact, enable a greater quantity of fiber to be consumed in a lesser volume. Legumes and nuts are also restricted. The term low residue has also been used to describe low fiber diets, although the two are not the same. Residue refers to any substance that adds to bowel (colonic) contents, such as sloughed cells and intestinal bacteria, in addition to fiber. Residue-containing foods, prunes and prune juice, milk, and connective tissue from meats may also need to be eliminated. It is important to eliminate these foods during sudden (acute) attacks of inflammatory bowel disease, when there is a partial obstruction due to another reason, or when minimal fecal output is desired. When these problems are gone, a more normal diet may be used. PURPOSE  Prevent blockage of a partially obstructed or narrowed gastrointestinal tract.   Reduce stool weight and volume.   Slow the movement of waste.  WHEN IS THIS DIET USED?  Acute phase of Crohn's disease, ulcerative colitis, regional enteritis, or diverticulitis.   Narrowing (stenosis) of intestinal or esophageal tubes (lumina).   Transitional diet following surgery, injury (trauma), or illness.  ADEQUACY This diet is nutritionally adequate based on individual food  choices according to the Recommended Dietary Allowances of the [employer]. CHOOSING FOODS Check labels, especially on foods from the starch list. Often, dietary fiber content is listed with the Nutrition Facts panel.  Breads and Starches  Allowed: White, [place], and pita breads, plain rolls, buns, or sweet rolls, doughnuts, waffles, pancakes, bagels. Plain muffins, sweet breads, biscuits, matzoth. Flour. Soda, saltine, or graham crackers. Pretzels, rusks, melba toast, zwieback. Cooked cereals: cornmeal, farina, cream cereals. Dry cereals: refined corn, wheat, rice, and oat cereals (check label). Potatoes prepared any way without skins, refined macaroni, spaghetti, noodles, refined rice.   Avoid: Bread, rolls, or crackers made with whole-wheat, multigrains, rye, bran seeds, nuts, or coconut. Corn tortillas, table-shells. Corn chips, tortilla chips. Cereals containing whole-grains, multigrains, bran, coconut, nuts, or raisins. Cooked or dry oatmeal. Coarse wheat cereals, granola. Cereals advertised as "high fiber." Potato skins. Whole-grain pasta, wild or brown rice. Popcorn.  Vegetables  Allowed:  Strained tomato and vegetable juices. Fresh: tender lettuce, cucumber, cabbage, spinach, bean sprouts. Cooked, canned: asparagus, bean sprouts, cut green or wax beans, cauliflower, pumpkin, beets, mushrooms, olives, spinach, yellow squash, tomato, tomato sauce (no seeds), zucchini (peeled), turnips. Canned sweet potatoes. Small amounts of celery, onion, radish, and green pepper may be used. Keep servings limited to  cup.   Avoid: Fresh, cooked, or canned: artichokes, baked beans, beet greens, broccoli, Brussels sprouts, French-style green beans, corn, kale, legumes, peas, sweet potatoes. Cooked: green or red cabbage, spinach. Avoid large servings of any vegetables.  Fruit  Allowed:  All fruit juices except prune juice. Cooked or canned: apricots applesauce, cantaloupe, cherries, grapefruit,  grapes, kiwi, mandarin oranges, peaches, pears,  fruit cocktail, pineapple, plums, watermelon. Fresh: banana, grapes, cantaloupe, avocado, cherries, pineapple, grapefruit, kiwi, nectarines, peaches, oranges, blueberries, plums. Keep servings limited to  cup or 1 piece.   Avoid: Fresh: apple with or without skin, apricots, mango, pears, raspberries, strawberries. Prune juice, stewed or dried prunes. Dried fruits, raisins, dates. Avoid large servings of all fresh fruits.  Meat and Meat Substitutes  Allowed:  Ground or well-cooked tender beef, ham, veal, lamb, pork, or poultry. Eggs, plain cheese. Fish, oysters, shrimp, lobster, other seafood. Liver, organ meats.   Avoid: Tough, fibrous meats with gristle. Peanut butter, smooth or chunky. Cheese with seeds, nuts, or other foods not allowed. Nuts, seeds, legumes, dried peas, beans, lentils.  Milk  Allowed:  All milk products except those not allowed. Milk and milk product consumption should be minimal when low residue is desired.   Avoid: Yogurt that contains nuts or seeds.  Soups and Combination Foods  Allowed:  Bouillon, broth, or cream soups made from allowed foods. Any strained soup. Casseroles or mixed dishes made with allowed foods.   Avoid: Soups made from vegetables that are not allowed or that contain other foods not allowed.  Desserts and Sweets  Allowed:  Plain cakes and cookies, pie made with allowed fruit, pudding, custard, cream pie. Gelatin, fruit, ice, sherbet, frozen ice pops. Ice cream, ice milk without nuts. Plain hard candy, honey, jelly, molasses, syrup, sugar, chocolate syrup, gumdrops, marshmallows.   Avoid: Desserts, cookies, or candies that contain nuts, peanut butter, or dried fruits. Jams, preserves with seeds, marmalade.  Fats and Oils  Allowed:  Margarine, butter, cream, mayonnaise, salad oils, plain salad dressings made from allowed foods. Plain gravy, crisp bacon without rind.   Avoid: Seeds, nuts, olives.  Avocados.  Beverages  Allowed:  All, except those listed to avoid.   Avoid: Fruit juices with high pulp, prune juice.  Condiments  Allowed:  Ketchup, mustard, horseradish, vinegar, cream sauce, cheese sauce, cocoa powder. Spices in moderation: allspice, basil, bay leaves, celery powder or leaves, cinnamon, cumin powder, curry powder, ginger, mace, marjoram, onion or garlic powder, oregano, paprika, parsley flakes, ground pepper, rosemary, sage, savory, tarragon, thyme, turmeric.   Avoid: Coconut, pickles.  SAMPLE MEAL PLAN The following menu is provided as a sample. Your daily menu plans will vary. Be sure to include a minimum of the following each day in order to provide essential nutrients for the adult:  Starch/Bread/Cereal Group, 6 servings.   Fruit/Vegetable Group, 5 servings.   Meat/Meat Substitute Group, 2 servings.   Milk/Milk Substitute Group, 2 servings.  A serving is equal to  cup for fruits, vegetables, and cooked cereals or 1 piece for foods such as a piece of bread, 1 orange, or 1 apple. For dry cereals and crackers, use serving sizes listed on the label. Combination foods may count as full or partial servings from various food groups. Fats, desserts, and sweets may be added to the meal plan after the requirements for essential nutrients are met. SAMPLE MENU Breakfast   cup orange juice.   1 boiled egg.   1 slice white toast.   Margarine.    cup cornflakes.   1 cup milk.   Beverage.  Lunch   cup chicken noodle soup.   2 to 3 oz sliced roast beef.   2 slices seedless rye bread.   Mayonnaise.    cup tomato juice.   1 small banana.   Beverage.  Dinner  3 oz baked chicken.    cup scalloped potatoes.  cup cooked beets.   White dinner roll.   Margarine.    cup canned peaches.   Beverage.  Document Released: 03/07/2002 Document Revised: 05/28/2011 Document Reviewed: 01/29/2009 [facility] Patient Information 2012 [place], [place].

## 2011-10-15 ENCOUNTER — Ambulatory Visit (AMBULATORY_SURGERY_CENTER): Payer: 59 | Admitting: Gastroenterology

## 2011-10-15 ENCOUNTER — Encounter: Payer: Self-pay | Admitting: Gastroenterology

## 2011-10-15 VITALS — BP 129/77 | HR 61 | Temp 98.7°F | Resp 27 | Ht 63.0 in | Wt 161.0 lb

## 2011-10-15 DIAGNOSIS — K2990 Gastroduodenitis, unspecified, without bleeding: Secondary | ICD-10-CM

## 2011-10-15 DIAGNOSIS — R197 Diarrhea, unspecified: Secondary | ICD-10-CM

## 2011-10-15 DIAGNOSIS — K2970 Gastritis, unspecified, without bleeding: Secondary | ICD-10-CM

## 2011-10-15 DIAGNOSIS — K219 Gastro-esophageal reflux disease without esophagitis: Secondary | ICD-10-CM

## 2011-10-15 DIAGNOSIS — R109 Unspecified abdominal pain: Secondary | ICD-10-CM

## 2011-10-15 MED ORDER — SODIUM CHLORIDE 0.9 % IV SOLN: 500.0000 mL | INTRAVENOUS | Status: DC

## 2011-10-15 MED ORDER — METOCLOPRAMIDE HCL 10 MG PO TABS: 10.0000 mg | ORAL_TABLET | Freq: Every day | ORAL | Status: DC

## 2011-10-15 NOTE — Op Note (Addendum)
[facility] [address] [place], [place]  [address]  ENDOSCOPY PROCEDURE REPORT  PATIENT:  Ariana Spencer, Ariana Spencer  MR#:  [id number] BIRTHDATE:  1986/06/29, 25 yrs. old  GENDER:  female  ENDOSCOPIST:  Vania Rea. Jarold Motto, MD, [facility] Referred by:  Stacie Glaze, M.D.  PROCEDURE DATE:  10/15/2011 PROCEDURE:  EGD with biopsy, 43239, EGD with biopsy for H. pylori 62130 ASA CLASS:  Class I INDICATIONS:  EPIGASTRIC PAIN AND NAUSEA.  MEDICATIONS:   There was residual sedation effect present from prior procedure., propofol (Diprivan) 150 mg IV TOPICAL ANESTHETIC:  DESCRIPTION OF PROCEDURE:   After the risks and benefits of the procedure were explained, informed consent was obtained.  The LB GIF-H180 [id number] endoscope was introduced through the mouth and advanced to the second portion of the duodenum.  The instrument was slowly withdrawn as the mucosa was fully examined. <<PROCEDUREIMAGES>>  Normal duodenal folds were noted.  The stomach was entered and closely examined. The antrum, angularis, and lesser curvature were well visualized, including a retroflexed view of the cardia and fundus. The stomach wall was normally distensable. The scope passed easily through the pylorus into the duodenum. CLO AND REGULAR BIOPSIES DONE.SOME RETAINED FOOD PRODUCTS NOTED.  The esophagus and gastroesophageal junction were completely normal in appearance.    Retroflexed views revealed no abnormalities.    The scope was then withdrawn from the patient and the procedure completed.  COMPLICATIONS:  None  ENDOSCOPIC IMPRESSION: 1) Normal duodenal folds 2) Normal stomach 3) Normal esophagus PROBABLE RESOLVING GASTRODUODENITIS,R/O ACUTE H.PYLORI INFECTION. RECOMMENDATIONS: 1) Await biopsy results 2) Rx CLO if positive 3) continue current medications TRIAL OF REGLAN 10 MG/QHS  ______________________________ Vania Rea. Jarold Motto, MD, [facility]  CC:  n. REVISED:  10/15/2011 02:31 PM eSIGNED:    Vania Rea. Patterson at 10/15/2011 02:31 PM  Jerel Shepherd, [phone]

## 2011-10-15 NOTE — Op Note (Signed)
[facility] [address] [place], [place]  [address]  FLEXIBLE SIGMOIDOSCOPY PROCEDURE REPORT  PATIENT:  Ariana Spencer, Ariana Spencer  MR#:  [id number] BIRTHDATE:  1986/06/20, 25 yrs. old  GENDER:  female  ENDOSCOPIST:  Vania Rea. Jarold Motto, MD, [facility] Referred by:  Stacie Glaze, M.D.  PROCEDURE DATE:  10/15/2011 PROCEDURE:  Flexible Sigmoidoscopy, diagnostic ASA CLASS:  Class I INDICATIONS:  unexplained diarrhea  MEDICATIONS:   propofol (Diprivan) 100 mg IV  DESCRIPTION OF PROCEDURE:   After the risks benefits and alternatives of the procedure were thoroughly explained, informed consent was obtained.  Digital rectal exam was performed and revealed no abnormalities.   The [id number]-CF-H180AL [id number] endoscope was introduced through the anus and advanced to the descending colon, without limitations.  The quality of the prep was excellent.  The instrument was then slowly withdrawn as the mucosa was fully examined. <<PROCEDUREIMAGES>>  The area of colon examined was normal in appearance. in the descending colon. 60 CM. LEVEL REACHED  The rectum and sigmoid appeared normal.   Retroflexed views in the rectum revealed no abnormalities.    The scope was then withdrawn from the patient and the procedure terminated.  COMPLICATIONS:  None  ENDOSCOPIC IMPRESSION: 1) Normal colon in the descending colon 2) Normal rect/sig no colitis noted.PROBABLE RESOLVING GASTROENTERITIS RECOMMENDATIONS: SYMPTOMATIC CARE.  REPEAT EXAM:  No  ______________________________ Vania Rea. Jarold Motto, MD, [facility]  CC:  n. eSIGNED:   Vania Rea. Ashish Rossetti at 10/15/2011 02:19 PM  Jerel Shepherd, [phone]

## 2011-10-15 NOTE — Patient Instructions (Addendum)
Green and blue discharge instructions reviewed with patient and care partner.  Impressions/recommendations:  Normal EGD and Normal colonoscopy.  Probably resolving gastroenteritis, gastroduodenitis.   Await pathology results for H. Pylori.  Resume medications as you had been taking them prior to your procedure.  Start Reglan 10 mg at bedtime daily. Prescription sent to pharmacy electronically.

## 2011-10-16 ENCOUNTER — Telehealth: Payer: Self-pay | Admitting: *Deleted

## 2011-10-16 DIAGNOSIS — R109 Unspecified abdominal pain: Secondary | ICD-10-CM

## 2011-10-16 LAB — HELICOBACTER PYLORI SCREEN-BIOPSY: UREASE: NEGATIVE

## 2011-10-16 NOTE — Telephone Encounter (Signed)
answering machine would not accept any more messages, states it was full

## 2011-10-30 ENCOUNTER — Encounter: Payer: Self-pay | Admitting: Internal Medicine

## 2011-12-27 ENCOUNTER — Emergency Department (INDEPENDENT_AMBULATORY_CARE_PROVIDER_SITE_OTHER)
Admission: EM | Admit: 2011-12-27 | Discharge: 2011-12-27 | Disposition: A | Discharge status: Home / Self Care | Payer: 59 | Source: Home / Self Care | Attending: Family Medicine | Admitting: Family Medicine

## 2011-12-27 ENCOUNTER — Encounter (HOSPITAL_COMMUNITY): Payer: Self-pay

## 2011-12-27 DIAGNOSIS — S91119A Laceration without foreign body of unspecified toe without damage to nail, initial encounter: Secondary | ICD-10-CM

## 2011-12-27 DIAGNOSIS — S91309A Unspecified open wound, unspecified foot, initial encounter: Secondary | ICD-10-CM

## 2011-12-27 DIAGNOSIS — W2209XA Striking against other stationary object, initial encounter: Secondary | ICD-10-CM

## 2011-12-27 MED ORDER — CEPHALEXIN 500 MG PO CAPS: 500.0000 mg | ORAL_CAPSULE | Freq: Four times a day (QID) | ORAL | Status: DC

## 2011-12-27 MED ORDER — CEPHALEXIN 500 MG PO CAPS: 500.0000 mg | ORAL_CAPSULE | Freq: Four times a day (QID) | ORAL | Status: AC

## 2011-12-27 MED ORDER — TETANUS-DIPHTH-ACELL PERTUSSIS 5-2.5-18.5 LF-MCG/0.5 IM SUSP
INTRAMUSCULAR | Status: AC
  Filled 2011-12-27: qty 0.5

## 2011-12-27 MED ORDER — TETANUS-DIPHTHERIA TOXOIDS TD 5-2 LFU IM INJ
0.5000 mL | INJECTION | Freq: Once | INTRAMUSCULAR | Status: AC
  Administered 2011-12-27: 0.5 mL via INTRAMUSCULAR

## 2011-12-27 NOTE — ED Notes (Signed)
Patient cut toe on piece of metal this evening;  Patient has laceration bottom of right 5th toe.  Patient unaware of last tetanus.

## 2011-12-27 NOTE — ED Provider Notes (Signed)
History     CSN: [id number]  Arrival date & time 12/27/11  4098   First MD Initiated Contact with Patient 12/27/11 1849      Chief Complaint  Patient presents with  . Extremity Laceration    (Consider location/radiation/quality/duration/timing/severity/associated sxs/prior treatment) Patient is a 26 y.o. female presenting with skin laceration. The history is provided by the patient.  Laceration  The incident occurred 3 to 5 hours ago. The laceration is located on the right foot. The laceration is 2 cm in size. The laceration mechanism was a a metal edge. The pain is mild. Her tetanus status is out of date.    Past Medical History  Diagnosis Date  . Herpes zoster 05/24/09  . IUD 06/19/08    HAS MIRENA IN PLACE/INSERTED 06/19/08  . Nausea and vomiting   . Migraines   . Generalized headaches   . Fatigue   . Depression     Past Surgical History  Procedure Date  . Augmentation mammaplasty 2007  . Wisdom tooth extraction 2007    Family History  Problem Relation Age of Onset  . Hypertension Father   . Cancer Maternal Grandmother     ovarian or uterine cancer and throat cancer  . Breast cancer Maternal Grandmother 31  . Lung cancer Paternal Grandfather     lung  . Lymphoma Paternal Grandfather   . Colon cancer Neg Hx     History  Substance Use Topics  . Smoking status: Never Smoker   . Smokeless tobacco: Never Used  . Alcohol Use: 0.0 oz/week    1-2 Glasses of wine per week     occas    OB History    Grav Para Term Preterm Abortions TAB SAB Ect Mult Living   0               Review of Systems  Constitutional: Negative.   HENT: Negative.   Eyes: Negative.   Respiratory: Negative.   Cardiovascular: Negative.   Gastrointestinal: Negative.   Genitourinary: Negative.   Musculoskeletal: Negative.   Skin: Positive for wound.  Neurological: Negative.     Allergies  Review of patient's allergies indicates no known allergies.  Home Medications   Current  Outpatient Rx  Name Route Sig Dispense Refill  . CEPHALEXIN 500 MG PO CAPS Oral Take 1 capsule (500 mg total) by mouth 4 (four) times daily. 28 capsule 0  . IBUPROFEN 200 MG PO TABS  as needed. For pain    . METOCLOPRAMIDE HCL 10 MG PO TABS Oral Take 1 tablet (10 mg total) by mouth at bedtime. 30 tablet 2  . RABEPRAZOLE SODIUM 20 MG PO TBEC Oral Take 1 tablet (20 mg total) by mouth daily. 15 tablet 0    BP 124/62  Pulse 75  Temp(Src) 98.2 F (36.8 C) (Oral)  SpO2 100%  Physical Exam  Nursing note and vitals reviewed. Constitutional: She is oriented to person, place, and time. She appears well-developed and well-nourished.  HENT:  Head: Normocephalic and atraumatic.  Eyes: EOM are normal.  Neck: Normal range of motion.  Pulmonary/Chest: Effort normal.  Musculoskeletal: Normal range of motion.  Neurological: She is alert and oriented to person, place, and time.  Skin: Skin is warm and dry. Laceration noted.       2 cm irregular laceration on plantar surface of RIGHT foot at MTP joint of 5th digit  Psychiatric: Her behavior is normal.    ED Course  LACERATION REPAIR Performed by: Renaee Munda  Authorized by: Delanna Notice B   (including critical care time)  Labs Reviewed - No data to display No results found.   1. Laceration of toe       MDM  Laceration repaired with 3 sutures; given rx for cephalexin        Renaee Munda, MD 12/27/11 2043

## 2011-12-27 NOTE — Discharge Instructions (Signed)
Take antibiotics as directed. Keep wound clean, dry, and covered. Return in 7 days for suture removal. Return to care sooner should signs of infection appear such as redness, warmth, increased pain, or purulent drainage.

## 2012-02-20 ENCOUNTER — Encounter: Payer: Self-pay | Admitting: Obstetrics and Gynecology

## 2012-02-20 ENCOUNTER — Ambulatory Visit (INDEPENDENT_AMBULATORY_CARE_PROVIDER_SITE_OTHER): Payer: 59 | Admitting: Obstetrics and Gynecology

## 2012-02-20 ENCOUNTER — Ambulatory Visit (INDEPENDENT_AMBULATORY_CARE_PROVIDER_SITE_OTHER): Payer: 59

## 2012-02-20 DIAGNOSIS — N949 Unspecified condition associated with female genital organs and menstrual cycle: Secondary | ICD-10-CM

## 2012-02-20 DIAGNOSIS — R350 Frequency of micturition: Secondary | ICD-10-CM

## 2012-02-20 DIAGNOSIS — R102 Pelvic and perineal pain: Secondary | ICD-10-CM

## 2012-02-20 LAB — URINALYSIS W MICROSCOPIC + REFLEX CULTURE
Bilirubin Urine: NEGATIVE
Casts: NONE SEEN
Crystals: NONE SEEN
Glucose, UA: NEGATIVE mg/dL
Ketones, ur: NEGATIVE mg/dL
Nitrite: NEGATIVE
Specific Gravity, Urine: 1.015 (ref 1.005–1.030)
pH: 6.5 (ref 5.0–8.0)

## 2012-02-20 NOTE — Progress Notes (Addendum)
The patient was in her usual good health until she woke up this morning with severe lower abdominal pain with nausea. It is mostly suprapubic. She is not running a fever. She actually is starting to feel better with the pain less acute. She last had intercourse a week ago and it was normal. She does not have menstrual cycles due to her Mirena IUD. In addition the patient has a two-month history of severe urinary frequency associated with urgency. She has not had dysuria or hematuria. Urinalysis done today was normal.  Exam: Kennon Portela present. Abdomen is soft without guarding rebound or masses.Pelvic exam: External within normal limits. BUS within normal limits. Vaginal exam within normal limits. Cervix is clean without lesions. IUD string visible. Uterus is normal size and shape but very tender.  Adnexa failed to reveal masses. Rectovaginal examination is confirmatory and without masses.   Assessment: #1. Acute onset of abdominal pain-ruptured ovarian cyst suspected. #2. Urinary frequency-probable  UTI.  Plan: Pelvic ultrasound. Urine culture. We will probably consider antibiotics based on culture. At some point we will refer to her urologist for evaluation for interstitial cystitis.  Patient returned for pelvic ultrasound. Her uterus is normal with an IUD in the endometrial cavity. Her right ovary does show an echo-free thin-walled avascular mass with a few low-level echoes of less than 2 cm. It appears to be collapsing and is consistent with a ruptured ovarian cyst. Her left ovary is normal. The patient does have free fluid both in the cul-de-sac and anterior to the uterus. I have told the patient that I suspect she did rupture her ovarian cyst and I believe her pain will continue to improve. She will keep me updated.

## 2012-02-22 LAB — URINE CULTURE

## 2012-12-18 ENCOUNTER — Inpatient Hospital Stay (HOSPITAL_COMMUNITY): Payer: 59

## 2012-12-18 ENCOUNTER — Inpatient Hospital Stay: Payer: Self-pay

## 2012-12-18 ENCOUNTER — Encounter (HOSPITAL_COMMUNITY): Payer: Self-pay

## 2012-12-18 ENCOUNTER — Inpatient Hospital Stay (HOSPITAL_COMMUNITY)
Admission: AD | Admit: 2012-12-18 | Discharge: 2012-12-18 | Disposition: A | Discharge status: Home / Self Care | Payer: 59 | Source: Ambulatory Visit | Attending: Gynecology | Admitting: Gynecology

## 2012-12-18 DIAGNOSIS — R1031 Right lower quadrant pain: Secondary | ICD-10-CM

## 2012-12-18 DIAGNOSIS — Z30431 Encounter for routine checking of intrauterine contraceptive device: Secondary | ICD-10-CM

## 2012-12-18 DIAGNOSIS — N949 Unspecified condition associated with female genital organs and menstrual cycle: Secondary | ICD-10-CM

## 2012-12-18 LAB — CBC WITH DIFFERENTIAL/PLATELET
Eosinophils Relative: 1 % (ref 0–5)
Lymphocytes Relative: 8 % — ABNORMAL LOW (ref 12–46)
Lymphs Abs: 0.8 10*3/uL (ref 0.7–4.0)
MCV: 88.5 fL (ref 78.0–100.0)
Neutro Abs: 8.1 10*3/uL — ABNORMAL HIGH (ref 1.7–7.7)
Platelets: 149 10*3/uL — ABNORMAL LOW (ref 150–400)
RBC: 4.85 MIL/uL (ref 3.87–5.11)
WBC: 9.8 10*3/uL (ref 4.0–10.5)

## 2012-12-18 LAB — URINE MICROSCOPIC-ADD ON

## 2012-12-18 LAB — URINALYSIS, ROUTINE W REFLEX MICROSCOPIC
Bilirubin Urine: NEGATIVE
Ketones, ur: 15 mg/dL — AB
Nitrite: NEGATIVE
Urobilinogen, UA: 0.2 mg/dL (ref 0.0–1.0)

## 2012-12-18 LAB — COMPREHENSIVE METABOLIC PANEL
ALT: 14 U/L (ref 0–35)
AST: 15 U/L (ref 0–37)
Alkaline Phosphatase: 57 U/L (ref 39–117)
CO2: 25 mEq/L (ref 19–32)
Calcium: 9.2 mg/dL (ref 8.4–10.5)
Chloride: 100 mEq/L (ref 96–112)
GFR calc Af Amer: >90 mL/min (ref >90)
GFR calc non Af Amer: >90 mL/min (ref >90)
Glucose, Bld: 92 mg/dL (ref 70–99)
Potassium: 4 mEq/L (ref 3.5–5.1)
Sodium: 136 mEq/L (ref 135–145)
Total Bilirubin: 0.6 mg/dL (ref 0.3–1.2)

## 2012-12-18 LAB — WET PREP, GENITAL
Trich, Wet Prep: NONE SEEN
Yeast Wet Prep HPF POC: NONE SEEN

## 2012-12-18 MED ORDER — KETOROLAC TROMETHAMINE 10 MG PO TABS: 10.0000 mg | ORAL_TABLET | Freq: Four times a day (QID) | ORAL | Status: DC | PRN

## 2012-12-18 MED ORDER — IOHEXOL 300 MG/ML  SOLN
100.0000 mL | Freq: Once | INTRAMUSCULAR | Status: AC | PRN
  Administered 2012-12-18: 100 mL via INTRAVENOUS

## 2012-12-18 MED ORDER — KETOROLAC TROMETHAMINE 30 MG/ML IJ SOLN
30.0000 mg | Freq: Four times a day (QID) | INTRAMUSCULAR | Status: DC | PRN
  Administered 2012-12-18: 30 mg via INTRAMUSCULAR
  Filled 2012-12-18: qty 1

## 2012-12-18 MED ORDER — IOHEXOL 300 MG/ML  SOLN
50.0000 mL | INTRAMUSCULAR | Status: AC
  Administered 2012-12-18: 1 mL via ORAL

## 2012-12-18 MED ORDER — LACTATED RINGERS IV BOLUS (SEPSIS)
1000.0000 mL | Freq: Once | INTRAVENOUS | Status: AC
  Administered 2012-12-18: 1000 mL via INTRAVENOUS

## 2012-12-18 NOTE — MAU Provider Note (Signed)
CC: Abdominal Pain    First Provider Initiated Contact with Patient 12/18/12 1844      HPI Ariana Spencer is a 27 yo nullip who began having  lower right suprapubic discomfort last night. She slept poorly and the pain has gotten progressively worse since noon today. She describes it as sharp and continuous. She has a decreased appetite and has not eaten today. Is nauseated now, but has not vomited. Denies dysuria, hematuria, frequency or urgency. Amenorrheic on IUD. No new sex partner.   Past Medical History  Diagnosis Date  . Herpes zoster 05/24/09  . IUD 06/19/08    HAS MIRENA IN PLACE/INSERTED 06/19/08  . Nausea and vomiting   . Migraines   . Generalized headaches   . Fatigue   . Depression         OB History   Grav Para Term Preterm Abortions TAB SAB Ect Mult Living   0               Past Surgical History  Procedure Laterality Date  . Augmentation mammaplasty  2007  . Wisdom tooth extraction  2007    History   Social History  . Marital Status: Single    Spouse Name: N/A    Number of Children: N/A  . Years of Education: N/A   Occupational History  . Cheer Coach  [employer]   Social History Main Topics  . Smoking status: Never Smoker   . Smokeless tobacco: Never Used  . Alcohol Use: 0.0 oz/week    1-2 Glasses of wine per week     Comment: occas  . Drug Use: No  . Sexually Active: Yes    Birth Control/ Protection: IUD     Comment: Mirena inserted 06-19-08   Other Topics Concern  . Not on file   Social History Narrative  . No narrative on file    No current facility-administered medications on file prior to encounter.   No current outpatient prescriptions on file prior to encounter.    No Known Allergies  ROS Pertinent items in HPI  PHYSICAL EXAM Filed Vitals:   12/18/12 1838  BP: 137/76  Pulse: 78  Temp: 97.9 F (36.6 C)  Resp: 16   General: Well nourished, well developed female in apparent distress,  tearful Cardiovascular: Normal rate Respiratory: Normal effort Abdomen: Soft, very tender right suprapubic region as well as diffuse RLQ pain, not localized to McBurney's point, guarding present without rebound. Bowel sound present in all four quadrants Patient is still moderately tender in the RLQ with palpation after administration of pain medication.  Back: No CVA tenderness Extremities: No edema Neurologic: Alert and oriented Speculum exam: External genitalia normal. Vaginal walls are pink and rugated. Moderate amount of thin, white discharge noted at the cervical os and in the vagina. No bleeding noted. Normal cervix contour.  Bimanual exam: No adnexal or suprapubic tenderness noted on bimanual exam  LAB RESULTS Results for orders placed during the hospital encounter of 12/18/12 (from the past 24 hour(s))  CBC WITH DIFFERENTIAL     Status: Abnormal   Collection Time    12/18/12  6:43 PM      Result Value Range   WBC 9.8  4.0 - 10.5 K/uL   RBC 4.85  3.87 - 5.11 MIL/uL   Hemoglobin 14.8  12.0 - 15.0 g/dL   HCT 40.9  81.1 - 91.4 %   MCV 88.5  78.0 - 100.0 fL   MCH 30.5  26.0 -  34.0 pg   MCHC 34.5  30.0 - 36.0 g/dL   RDW 16.1  09.6 - 04.5 %   Platelets 149 (*) 150 - 400 K/uL   Neutrophils Relative 83 (*) 43 - 77 %   Neutro Abs 8.1 (*) 1.7 - 7.7 K/uL   Lymphocytes Relative 8 (*) 12 - 46 %   Lymphs Abs 0.8  0.7 - 4.0 K/uL   Monocytes Relative 8  3 - 12 %   Monocytes Absolute 0.8  0.1 - 1.0 K/uL   Eosinophils Relative 1  0 - 5 %   Eosinophils Absolute 0.1  0.0 - 0.7 K/uL   Basophils Relative 0  0 - 1 %   Basophils Absolute 0.0  0.0 - 0.1 K/uL  COMPREHENSIVE METABOLIC PANEL     Status: None   Collection Time    12/18/12  6:43 PM      Result Value Range   Sodium 136  135 - 145 mEq/L   Potassium 4.0  3.5 - 5.1 mEq/L   Chloride 100  96 - 112 mEq/L   CO2 25  19 - 32 mEq/L   Glucose, Bld 92  70 - 99 mg/dL   BUN 16  6 - 23 mg/dL   Creatinine, Ser 4.09  0.50 - 1.10 mg/dL    Calcium 9.2  8.4 - 81.1 mg/dL   Total Protein 6.8  6.0 - 8.3 g/dL   Albumin 4.0  3.5 - 5.2 g/dL   AST 15  0 - 37 U/L   ALT 14  0 - 35 U/L   Alkaline Phosphatase 57  39 - 117 U/L   Total Bilirubin 0.6  0.3 - 1.2 mg/dL   GFR calc non Af Amer >90  >90 mL/min   GFR calc Af Amer >90  >90 mL/min  URINALYSIS, ROUTINE W REFLEX MICROSCOPIC     Status: Abnormal   Collection Time    12/18/12  6:43 PM      Result Value Range   Color, Urine YELLOW  YELLOW   APPearance CLEAR  CLEAR   Specific Gravity, Urine 1.015  1.005 - 1.030   pH 7.0  5.0 - 8.0   Glucose, UA NEGATIVE  NEGATIVE mg/dL   Hgb urine dipstick TRACE (*) NEGATIVE   Bilirubin Urine NEGATIVE  NEGATIVE   Ketones, ur 15 (*) NEGATIVE mg/dL   Protein, ur NEGATIVE  NEGATIVE mg/dL   Urobilinogen, UA 0.2  0.0 - 1.0 mg/dL   Nitrite NEGATIVE  NEGATIVE   Leukocytes, UA TRACE (*) NEGATIVE  URINE MICROSCOPIC-ADD ON     Status: Abnormal   Collection Time    12/18/12  6:43 PM      Result Value Range   Squamous Epithelial / LPF FEW (*) RARE   WBC, UA 0-2  <3 WBC/hpf   RBC / HPF 3-6  <3 RBC/hpf   Urine-Other MUCOUS PRESENT    POCT PREGNANCY, URINE     Status: None   Collection Time    12/18/12  6:51 PM      Result Value Range   Preg Test, Ur NEGATIVE  NEGATIVE  WET PREP, GENITAL     Status: Abnormal   Collection Time    12/18/12  8:20 PM      Result Value Range   Yeast Wet Prep HPF POC NONE SEEN  NONE SEEN   Trich, Wet Prep NONE SEEN  NONE SEEN   Clue Cells Wet Prep HPF POC NONE SEEN  NONE SEEN  WBC, Wet Prep HPF POC FEW (*) NONE SEEN    IMAGING No results found.  MAU COURSE  ASSESSMENT  1. Unspecified symptom associated with female genital organs   2. Abdominal pain, right lower quadrant   3. Surveillance of previously prescribed intrauterine contraceptive device     PLAN Discharge home. See AVS for patient education.    Medication List    ASK your doctor about these medications       acetaminophen 325 MG tablet   Commonly known as:  TYLENOL  Take 325 mg by mouth every 6 (six) hours as needed for pain.          Freddi Starr, PA-C 12/18/2012 8:44 PM  Care assumed by Joseph Berkshire, PA, at [address], PA-C 12/18/2012 8:44 PM   2000 - Patient returning from radiology. Care assumed from Greta Doom, [place]  Physical exam completed as noted above. Wet prep and GC/Chlamydia obtained.  Patient continues to have moderate tenderness of the RLQ with palpation.   MDM Called results to Dr. Lily Peer. He recommends we pull IUD here today and get CT scan of abdomen to ensure no appendicitis or other abnormalities.   Results for orders placed during the hospital encounter of 12/18/12 (from the past 24 hour(s))  CBC WITH DIFFERENTIAL     Status: Abnormal   Collection Time    12/18/12  6:43 PM      Result Value Range   WBC 9.8  4.0 - 10.5 K/uL   RBC 4.85  3.87 - 5.11 MIL/uL   Hemoglobin 14.8  12.0 - 15.0 g/dL   HCT 16.1  09.6 - 04.5 %   MCV 88.5  78.0 - 100.0 fL   MCH 30.5  26.0 - 34.0 pg   MCHC 34.5  30.0 - 36.0 g/dL   RDW 40.9  81.1 - 91.4 %   Platelets 149 (*) 150 - 400 K/uL   Neutrophils Relative 83 (*) 43 - 77 %   Neutro Abs 8.1 (*) 1.7 - 7.7 K/uL   Lymphocytes Relative 8 (*) 12 - 46 %   Lymphs Abs 0.8  0.7 - 4.0 K/uL   Monocytes Relative 8  3 - 12 %   Monocytes Absolute 0.8  0.1 - 1.0 K/uL   Eosinophils Relative 1  0 - 5 %   Eosinophils Absolute 0.1  0.0 - 0.7 K/uL   Basophils Relative 0  0 - 1 %   Basophils Absolute 0.0  0.0 - 0.1 K/uL  COMPREHENSIVE METABOLIC PANEL     Status: None   Collection Time    12/18/12  6:43 PM      Result Value Range   Sodium 136  135 - 145 mEq/L   Potassium 4.0  3.5 - 5.1 mEq/L   Chloride 100  96 - 112 mEq/L   CO2 25  19 - 32 mEq/L   Glucose, Bld 92  70 - 99 mg/dL   BUN 16  6 - 23 mg/dL   Creatinine, Ser 7.82  0.50 - 1.10 mg/dL   Calcium 9.2  8.4 - 95.6 mg/dL   Total Protein 6.8  6.0 - 8.3 g/dL   Albumin 4.0  3.5 - 5.2 g/dL   AST 15  0 -  37 U/L   ALT 14  0 - 35 U/L   Alkaline Phosphatase 57  39 - 117 U/L   Total Bilirubin 0.6  0.3 - 1.2 mg/dL   GFR calc non Af Amer >90  >  90 mL/min   GFR calc Af Amer >90  >90 mL/min  URINALYSIS, ROUTINE W REFLEX MICROSCOPIC     Status: Abnormal   Collection Time    12/18/12  6:43 PM      Result Value Range   Color, Urine YELLOW  YELLOW   APPearance CLEAR  CLEAR   Specific Gravity, Urine 1.015  1.005 - 1.030   pH 7.0  5.0 - 8.0   Glucose, UA NEGATIVE  NEGATIVE mg/dL   Hgb urine dipstick TRACE (*) NEGATIVE   Bilirubin Urine NEGATIVE  NEGATIVE   Ketones, ur 15 (*) NEGATIVE mg/dL   Protein, ur NEGATIVE  NEGATIVE mg/dL   Urobilinogen, UA 0.2  0.0 - 1.0 mg/dL   Nitrite NEGATIVE  NEGATIVE   Leukocytes, UA TRACE (*) NEGATIVE  URINE MICROSCOPIC-ADD ON     Status: Abnormal   Collection Time    12/18/12  6:43 PM      Result Value Range   Squamous Epithelial / LPF FEW (*) RARE   WBC, UA 0-2  <3 WBC/hpf   RBC / HPF 3-6  <3 RBC/hpf   Urine-Other MUCOUS PRESENT    POCT PREGNANCY, URINE     Status: None   Collection Time    12/18/12  6:51 PM      Result Value Range   Preg Test, Ur NEGATIVE  NEGATIVE  WET PREP, GENITAL     Status: Abnormal   Collection Time    12/18/12  8:20 PM      Result Value Range   Yeast Wet Prep HPF POC NONE SEEN  NONE SEEN   Trich, Wet Prep NONE SEEN  NONE SEEN   Clue Cells Wet Prep HPF POC NONE SEEN  NONE SEEN   WBC, Wet Prep HPF POC FEW (*) NONE SEEN   *RADIOLOGY REPORT*  Clinical Data: 27 year old female with right abdominal and pelvic  pain.  CT ABDOMEN AND PELVIS WITH CONTRAST  Technique: Multidetector CT imaging of the abdomen and pelvis was  performed following the standard protocol during bolus  administration of intravenous contrast.  Contrast: OMNIPAQUE IOHEXOL 300 MG/ML SOLN  Comparison: 10/03/2011 CT  Findings: The lung bases are clear.  The liver, spleen, adrenal glands, kidneys, pancreas and  gallbladder are unremarkable.  No enlarged  lymph nodes, biliary dilation or abdominal aortic  aneurysm identified.  The bowel, appendix and bladder are unremarkable.  The uterus and adnexal regions are within normal limits.  A trace amount of free pelvic fluid is likely physiologic.  There is no evidence of bowel obstruction, mass or  pneumoperitoneum.  No acute or suspicious bony abnormalities are identified.  IMPRESSION:  No evidence of acute or significant abnormality.  Normal appendix.  Original Report Authenticated By: Harmon Pier, M.D.  MDM CT scan results discussed with Dr. Lily Peer. He recommends Rx for Toradol PO to be sent to pharmacy and have patient follow-up in office this week.   A: Abdominal pain, most likely caused by IUD  P: Discharge home Rx for Toradol sent to patient's pharmacy Patient cautioned to use protection to avoid pregnancy until new method of birth control is established Patient encouraged to call [facility] Monday to make a follow-up appointment for this week Patient may return to MAU as needed or if her condition were to change or worsen  Freddi Starr, PA-C 12/19/2012 6:41 AM

## 2012-12-18 NOTE — MAU Note (Signed)
Right side abdominal pain rates pain 8/10 history of ruptured cyst on that side about 8 months ago, has IUD due to come out in November.

## 2012-12-21 ENCOUNTER — Encounter: Payer: Self-pay | Admitting: Gynecology

## 2012-12-21 ENCOUNTER — Ambulatory Visit (INDEPENDENT_AMBULATORY_CARE_PROVIDER_SITE_OTHER): Payer: 59 | Admitting: Gynecology

## 2012-12-21 VITALS — BP 124/78

## 2012-12-21 DIAGNOSIS — IMO0001 Reserved for inherently not codable concepts without codable children: Secondary | ICD-10-CM

## 2012-12-21 DIAGNOSIS — N949 Unspecified condition associated with female genital organs and menstrual cycle: Secondary | ICD-10-CM

## 2012-12-21 DIAGNOSIS — R234 Changes in skin texture: Secondary | ICD-10-CM

## 2012-12-21 DIAGNOSIS — Z8742 Personal history of other diseases of the female genital tract: Secondary | ICD-10-CM

## 2012-12-21 DIAGNOSIS — R238 Other skin changes: Secondary | ICD-10-CM

## 2012-12-21 DIAGNOSIS — R102 Pelvic and perineal pain: Secondary | ICD-10-CM | POA: Insufficient documentation

## 2012-12-21 DIAGNOSIS — Z309 Encounter for contraceptive management, unspecified: Secondary | ICD-10-CM

## 2012-12-21 MED ORDER — LEVONORGEST-ETH EST & ETH EST 42-21-21-7 DAYS PO TABS: 1.0000 | ORAL_TABLET | Freq: Every day | ORAL | Status: DC

## 2012-12-21 NOTE — Patient Instructions (Signed)
Oral Contraception Information Oral contraceptives (OCs) are medicines taken to prevent pregnancy. OCs work by preventing the ovaries from releasing eggs. The hormones in OCs also cause the cervical mucus to thicken, preventing the sperm from entering the uterus. The hormones also cause the uterine lining to become thin, not allowing a fertilized egg to attach to the inside of the uterus. OCs are highly effective when taken exactly as prescribed. However, OCs do not prevent sexually transmitted diseases (STDs). Safe sex practices, such as using condoms along with the pill, can help prevent STDs.  Before taking the pill, you may have a physical exam and Pap test. Your caregiver may order blood tests that may be necessary. Your caregiver will make sure you are a good candidate for oral contraception. Discuss with your caregiver the possible side effects of the OC you may be prescribed. When starting an OC, it can take 2 to 3 months for the body to adjust to the changes in hormone levels in your body.  TYPES OF ORAL CONTRACEPTION  The combination pill. This pill contains estrogen and progestin (synthetic progesterone) hormones. The combination pill comes in either 21-day or 28-day packs. With 21-day packs, you do not take pills for 7 days after the last pill. With 28-day packs, the pill is taken every day. The last 7 pills are without hormones. Certain types of pills have more than 21 hormone-containing pills.  The minipill. This pill contains the progesterone hormone only. It is taken every day continuously. The minipill comes in packs of 91 pills. The first 84 pills contain the hormones, and the last 7 pills do not. The last 7 days are when you will have your menstrual period. You may experience irregular spotting. ADVANTAGES  Decreases premenstrual symptoms.  Treats menstrual period cramps.  Regulates the menstrual cycle.  Decreases a heavy menstrual flow.  Treats acne.  Treats abnormal uterine  bleeding.  Treats chronic pelvic pain.  Treats polycystic ovarian syndrome.  Treats endometriosis.  Can be used as emergency contraception. DISADVANTAGES OCs can be less effective if:  You forget to take the pill at the same time every day.  You have a stomach or intestinal disease that lessens the absorption of the pill.  You take OCs with other medicines that make OCs less effective.  You take expired OCs.  You forget to restart the pill on day 7, when using the packs of 21 pills. Document Released: 12/06/2002 Document Revised: 12/08/2011 Document Reviewed: 01/22/2011 [facility] Patient Information 2013 [place], [place].  Endometriosis Endometriosis is a disease that occurs when the endometrium (lining of the uterus) is misplaced outside of its normal location. It may occur in many locations close to the uterus (womb), but commonly on the ovaries, fallopian tubes, vagina (birth canal) and bowel located close to the uterus. Because the uterus sloughs (expels) its lining every month (menses), there is bleeding whereever the endometrial tissue is located. SYMPTOMS  Often there are no symptoms. However, because blood is irritating to tissues not normally exposed to it, when symptoms occur they vary with the location of the misplaced endometrium. Symptoms often include back and abdominal pain. Periods may be heavier and intercourse may be painful. Infertility may be present. You may have all of these symptoms at one time or another or you may have months with no symptoms at all. Although the symptoms occur mainly during menses, they can occur mid-cycle as well, and usually terminate with menopause. DIAGNOSIS  Your caregiver may recommend a blood test and  urine test (urinalysis) to help rule out other conditions. Another common test is ultrasound, a painless procedure that uses sound waves to make a sonogram "picture" of abnormal tissue that could be endometriosis. If your bowel movements are  painful around your periods, your caregiver may advise a barium enema (an X-ray of the lower bowel), to try to find the source of your pain. This is sometimes confirmed by laparoscopy. Laparoscopy is a procedure where your caregiver looks into your abdomen with a laparoscope (a small pencil sized telescope). Your caregiver may take a tiny piece of tissue (biopsy) from any abnormal tissue to confirm or document your problem. These tissues are sent to the lab and a pathologist looks at them under the microscope to give a microscopic diagnosis. TREATMENT  Once the diagnosis is made, it can be treated by destruction of the misplaced endometrial tissue using heat (diathermy), laser, cutting (excision), or chemical means. It may also be treated with hormonal therapy. When using hormonal therapy menses are eliminated, therefore eliminating the monthly exposure to blood by the misplaced endometrial tissue. Only in severe cases is it necessary to perform a hysterectomy with removal of the tubes, uterus and ovaries. HOME CARE INSTRUCTIONS   Only take over-the-counter or prescription medicines for pain, discomfort, or fever as directed by your caregiver.  Avoid activities that produce pain, including a physical sexual relationship.  Do not take aspirin as this may increase bleeding when not on hormonal therapy.  See your caregiver for pain or problems not controlled with treatment. SEEK IMMEDIATE MEDICAL CARE IF:   Your pain is severe and is not responding to pain medicine.  You develop severe nausea and vomiting, or you cannot keep foods down.  Your pain localizes to the right lower part of your abdomen (possible appendicitis).  You have swelling or increasing pain in the abdomen.  You have a fever.  You see blood in your stool. MAKE SURE YOU:   Understand these instructions.  Will watch your condition.  Will get help right away if you are not doing well or get worse. Document Released:  09/12/2000 Document Revised: 12/08/2011 Document Reviewed: 05/03/2008 [facility] Patient Information 2013 [place], [place].  Diagnostic Laparoscopy Laparoscopy is a surgical procedure. It is used to diagnose and treat diseases inside the belly(abdomen). It is usually a brief, common, and relatively simple procedure. The laparoscopeis a thin, lighted, pencil-sized instrument. It is like a telescope. It is inserted into your abdomen through a small cut (incision). Your caregiver can look at the organs inside your body through this instrument. He or she can see if there is anything abnormal. Laparoscopy can be done either in a hospital or outpatient clinic. You may be given a mild sedative to help you relax before the procedure. Once in the operating room, you will be given a drug to make you sleep (general anesthesia). Laparoscopy usually lasts less than 1 hour. After the procedure, you will be monitored in a recovery area until you are stable and doing well. Once you are home, it will take 2 to 3 days to fully recover. RISKS AND COMPLICATIONS  Laparoscopy has relatively few risks. Your caregiver will discuss the risks with you before the procedure. Some problems that can occur include:  Infection.  Bleeding.  Damage to other organs.  Anesthetic side effects. PROCEDURE Once you receive anesthesia, your surgeon inflates the abdomen with a harmless gas (carbon dioxide). This makes the organs easier to see. The laparoscope is inserted into the abdomen through  a small incision. This allows your surgeon to see into the abdomen. Other small instruments are also inserted into the abdomen through other small openings. Many surgeons attach a video camera to the laparoscope to enlarge the view. During a diagnostic laparoscopy, the surgeon may be looking for inflammation, infection, or cancer. Your surgeon may take tissue samples(biopsies). The samples are sent to a specialist in looking at cells and tissue  samples (pathologist). The pathologist examines them under a microscope. Biopsies can help to diagnose or confirm a disease. AFTER THE PROCEDURE   The gas is released from inside the abdomen.  The incisions are closed with stitches (sutures). Because these incisions are small (usually less than 1/2 inch), there is usually minimal discomfort after the procedure. There may be some mild discomfort in the throat. This is from the tube placed in the throat while you were sleeping. You may have some mild abdominal discomfort. There may also be discomfort from the instrument placement incisions in the abdomen.  The recovery time is shortened as long as there are no complications.  You will rest in a recovery room until stable and doing well. As long as there are no complications, you may be allowed to go home. FINDING OUT THE RESULTS OF YOUR TEST Not all test results are available during your visit. If your test results are not back during the visit, make an appointment with your caregiver to find out the results. Do not assume everything is normal if you have not heard from your caregiver or the medical facility. It is important for you to follow up on all of your test results. HOME CARE INSTRUCTIONS   Take all medicines as directed.  Only take over-the-counter or prescription medicines for pain, discomfort, or fever as directed by your caregiver.  Resume daily activities as directed.  Showers are preferred over baths.  You may resume sexual activities in 1 week or as directed.  Do not drive while taking narcotics. SEEK MEDICAL CARE IF:   There is increasing abdominal pain.  There is new pain in the shoulders (shoulder strap areas).  You feel lightheaded or faint.  You have the chills.  You or your child has an oral temperature above 102 F (38.9 C).  There is pus-like (purulent) drainage from any of the wounds.  You are unable to pass gas or have a bowel movement.  You feel sick  to your stomach (nauseous) or throw up (vomit). MAKE SURE YOU:   Understand these instructions.  Will watch your condition.  Will get help right away if you are not doing well or get worse. Document Released: 12/22/2000 Document Revised: 12/08/2011 Document Reviewed: 09/15/2007 [facility] Patient Information 2013 [place], [place].

## 2012-12-21 NOTE — Progress Notes (Addendum)
Patient is a 27 year old who was at [facility] hospital emergency room on March 23 having lower right suprapubic discomfort which started the night before. She stated it began to get progressively worse so she showed up to the emergency room. She described the symptoms is sharp and continuous and had some decrease appetite and some nausea but no vomiting. She denied any dysuria, frequency, urgency or hematuria. She has a Mirena IUD that was scheduled to be removed in November of this year. Patient has a steady partner.  In that visit she had the following done: Normal pelvic ultrasound with IUD in place  The IUD was removed to see if this would alleviate the patient's symptoms and underwent a spiral CT of the pelvis to rule out appendicitis as well. Her CT scan was normal.\  The following blood work was drawn: CBC,CMET,blood sugar and urinalysis all normal Urine pregnancy test negative GC and Chlamydia culture and wet prep all negative  Patient presented to the office today for followup. She states that since the IUD was removed that she feels better although not completely resolve her lower discomfort now as more told. She denies any fever chills nausea or vomiting.  Exam: Abdomen: Soft nontender no rebound or guarding Pelvic: Bartholin urethra Skene was within normal limits Vagina: No lesions or discharge Cervix: No lesions or discharge Uterus: Anteverted normal size shape and consistency Adnexa: No palpable masses or tenderness Rectal exam not done   Review of her record indicated in the past at the age of 59 she had been started on Ortho Tri-Cyclen oral contraceptive pill and did well. She later was switched to Yasmin and then eventually to the NuvaRing which she had to discontinue that she could not tolerate the side effects. 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 Prior to removal of the  IUD in the emergency room it had been a little over 4 years since she had had the Mirena IUD. We discussed different alternatives for contraception. She would like to go back on the oral contraceptive pill. We discussed the possibility of underlying endometriosis. Literature information on endometriosis as well as a laparoscopy was provided. We discussed different treatment options for endometriosis. She is going to be place on a continuous oral contraceptive pill so that she would withdrawal every 3 months and only has 4 periods a year. She will be prescribed Quartet OCP which she will start on the second day of her upcoming menstrual cycle. She will meanwhile use barrier contraception. She is scheduled to return back next month for her annual exam. The risks benefits and pros and cons of oral contraceptive pills to include DVT and pulmonary embolism were discussed. Patient denies any family history of any bleeding or clotting disorders.

## 2012-12-22 ENCOUNTER — Telehealth: Payer: Self-pay | Admitting: *Deleted

## 2012-12-22 DIAGNOSIS — R1031 Right lower quadrant pain: Secondary | ICD-10-CM

## 2012-12-22 NOTE — Telephone Encounter (Signed)
Left message for pt to call.

## 2012-12-22 NOTE — Telephone Encounter (Signed)
Please call in a prescription for Ultram 50 mg which she can take 1 by mouth every 6 hours when necessary. Please make an appointment for her to see the gastroenterologist DrMarland Kitchen Sheryn Bison with the [facility] as well.

## 2012-12-22 NOTE — Telephone Encounter (Signed)
Pt said she would like referral placed. I will call and make appointment tomorrow.

## 2012-12-22 NOTE — Telephone Encounter (Signed)
Pt was seen on 12/21/12 due right side pelvic pain, her pain is not as bad. She is still having a hard time with decrease appetite and some nausea but no vomiting and bloating. She tired to eat some soup and got half of it down, but had to stop because of the nausea. She asked if you have any other recommendations. Please advise

## 2012-12-24 NOTE — Telephone Encounter (Signed)
No referral needed she is a patient at dr. Jarold Motto office. Appt. On 12/30/12 @ 9:45 am

## 2012-12-30 ENCOUNTER — Encounter: Payer: Self-pay | Admitting: Gynecology

## 2012-12-30 ENCOUNTER — Ambulatory Visit: Payer: 59 | Admitting: Gastroenterology

## 2013-01-05 ENCOUNTER — Encounter: Payer: Self-pay | Admitting: Gynecology

## 2013-01-26 ENCOUNTER — Other Ambulatory Visit (HOSPITAL_COMMUNITY)
Admission: RE | Admit: 2013-01-26 | Discharge: 2013-01-26 | Disposition: A | Discharge status: Home / Self Care | Payer: 59 | Source: Ambulatory Visit | Attending: Gynecology | Admitting: Gynecology

## 2013-01-26 ENCOUNTER — Encounter: Payer: Self-pay | Admitting: Gynecology

## 2013-01-26 ENCOUNTER — Ambulatory Visit (INDEPENDENT_AMBULATORY_CARE_PROVIDER_SITE_OTHER): Payer: 59 | Admitting: Gynecology

## 2013-01-26 VITALS — BP 124/78 | Ht 63.0 in | Wt 182.0 lb

## 2013-01-26 DIAGNOSIS — Z01419 Encounter for gynecological examination (general) (routine) without abnormal findings: Secondary | ICD-10-CM | POA: Insufficient documentation

## 2013-01-26 LAB — CBC WITH DIFFERENTIAL/PLATELET
Basophils Relative: 0 % (ref 0–1)
Eosinophils Absolute: 0.1 10*3/uL (ref 0.0–0.7)
HCT: 39.7 % (ref 36.0–46.0)
Hemoglobin: 13.9 g/dL (ref 12.0–15.0)
Lymphs Abs: 2.1 10*3/uL (ref 0.7–4.0)
MCH: 30.4 pg (ref 26.0–34.0)
MCHC: 35 g/dL (ref 30.0–36.0)
Monocytes Absolute: 0.8 10*3/uL (ref 0.1–1.0)
Monocytes Relative: 9 % (ref 3–12)
Neutro Abs: 5.4 10*3/uL (ref 1.7–7.7)
Neutrophils Relative %: 64 % (ref 43–77)
RBC: 4.57 MIL/uL (ref 3.87–5.11)

## 2013-01-26 NOTE — Progress Notes (Signed)
Ariana Spencer 20-Jan-1986 [id number]   History:    27 y.o.  for annual gyn exam with no complaints  today. Patient had her IUD removed on March 25 and has no longer had pelvic pains. See previous note dated 12/21/2012 for details. She's not using any form of contraception and sexually active. She reports her first cycle since the Mirena IUD was removed was normal. She received 2 doses of the HPV vaccine several years ago. Her Tdap vaccine is up-to-date. Patient denies any prior history of abnormal Pap smears.   Past medical history,surgical history, family history and social history were all reviewed and documented in the EPIC chart.  Gynecologic History Patient's last menstrual period was 01/03/2013. Contraception: none Last Pap: 2011. Results were: normal Last mammogram: not indicated. Results were: not indicated  Obstetric History OB History   Grav Para Term Preterm Abortions TAB SAB Ect Mult Living   0                ROS: A ROS was performed and pertinent positives and negatives are included in the history.  GENERAL: No fevers or chills. HEENT: No change in vision, no earache, sore throat or sinus congestion. NECK: No pain or stiffness. CARDIOVASCULAR: No chest pain or pressure. No palpitations. PULMONARY: No shortness of breath, cough or wheeze. GASTROINTESTINAL: No abdominal pain, nausea, vomiting or diarrhea, melena or bright red blood per rectum. GENITOURINARY: No urinary frequency, urgency, hesitancy or dysuria. MUSCULOSKELETAL: No joint or muscle pain, no back pain, no recent trauma. DERMATOLOGIC: No rash, no itching, no lesions. ENDOCRINE: No polyuria, polydipsia, no heat or cold intolerance. No recent change in weight. HEMATOLOGICAL: No anemia or easy bruising or bleeding. NEUROLOGIC: No headache, seizures, numbness, tingling or weakness. PSYCHIATRIC: No depression, no loss of interest in normal activity or change in sleep pattern.     Exam: chaperone present  BP 124/78   Ht 5\' 3"  (1.6 m)  Wt 182 lb (82.555 kg)  BMI 32.25 kg/m2  LMP 01/03/2013  Body mass index is 32.25 kg/(m^2).  General appearance : Well developed well nourished female. No acute distress HEENT: Neck supple, trachea midline, no carotid bruits, no thyroidmegaly Lungs: Clear to auscultation, no rhonchi or wheezes, or rib retractions  Heart: Regular rate and rhythm, no murmurs or gallops Breast:Examined in sitting and supine position were symmetrical in appearance, no palpable masses or tenderness,  no skin retraction, no nipple inversion, no nipple discharge, no skin discoloration, no axillary or supraclavicular lymphadenopathy Abdomen: no palpable masses or tenderness, no rebound or guarding Extremities: no edema or skin discoloration or tenderness  Pelvic:  Bartholin, Urethra, Skene Glands: Within normal limits             Vagina: No gross lesions or discharge  Cervix: No gross lesions or discharge  Uterus  anteverted, normal size, shape and consistency, non-tender and mobile  Adnexa  Without masses or tenderness  Anus and perineum  normal   Rectovaginal  normal sphincter tone without palpated masses or tenderness             Hemoccult that indicated     Assessment/Plan:  27 y.o. female for annual exam who is not using any form of contraception. She states that she does not minor she got pregnant. She will be started on prenatal vitamins for neural tube defect prophylaxis. The following lab work today: Pap smear, CBC, urinalysis, and GC and Chlamydia culture. She was reminded on the importance of monthly self breast examination.  Ok Edwards MD, 6:21 PM 01/26/2013

## 2013-01-27 LAB — URINALYSIS W MICROSCOPIC + REFLEX CULTURE
Bilirubin Urine: NEGATIVE
Casts: NONE SEEN
Glucose, UA: NEGATIVE mg/dL
Leukocytes, UA: NEGATIVE
Protein, ur: NEGATIVE mg/dL
Squamous Epithelial / LPF: NONE SEEN
pH: 5 (ref 5.0–8.0)

## 2013-02-02 ENCOUNTER — Ambulatory Visit (INDEPENDENT_AMBULATORY_CARE_PROVIDER_SITE_OTHER): Payer: 59 | Admitting: Gynecology

## 2013-02-02 ENCOUNTER — Encounter: Payer: Self-pay | Admitting: Gynecology

## 2013-02-02 VITALS — BP 120/78

## 2013-02-02 DIAGNOSIS — N912 Amenorrhea, unspecified: Secondary | ICD-10-CM

## 2013-02-02 DIAGNOSIS — Z331 Pregnant state, incidental: Secondary | ICD-10-CM

## 2013-02-02 DIAGNOSIS — Z3490 Encounter for supervision of normal pregnancy, unspecified, unspecified trimester: Secondary | ICD-10-CM

## 2013-02-02 NOTE — Progress Notes (Signed)
Patient is a 27 year old now gravida 1 who was seen the office on April 30 for annual gynecological examination. Patient had her IUD removal March 25 of this year and was no longer having pelvic pain that she had while she had the IUD in place. She was not using any form of contraception and apparent to get pregnant and was taking prenatal vitamins. She had a positive home pregnancy test and presented to the office today. Patient denies any unusual bleeding or spotting or cramping or nausea or vomiting.  Patient denies any prior history of any pelvic infections or any pelvic surgery. Patient denies any family member on her side of the family or her husband with any birth defects.  Exam: Abdomen: Soft nontender no rebound or guarding Pelvic: Bartholin urethra Skene was within normal limits Vagina: No lesions or discharge Cervix: No lesions or discharge  Uterus: Anteverted normal size shape and consistency  uterus: 4-6 weeks size nontender Adnexa: No palpable masses or tenderness rectal exam: Not done  Urine pregnancy test in the office positive  Assessment/plan: Early pregnancy based on last menstrual period April 5 patient would be for a half weeks gestation today with a tentative due date 10/08/2013. She will have a quantitative beta-hCG today and we will repeated in one week and followup with an ultrasound May 26. She was reminded to stay on her prenatal vitamins. Literature information on pregnancy was provided also.

## 2013-02-02 NOTE — Patient Instructions (Signed)

## 2013-02-07 ENCOUNTER — Telehealth: Payer: Self-pay | Admitting: Gastroenterology

## 2013-02-07 NOTE — Telephone Encounter (Signed)
Message copied by Arna Snipe on Mon Feb 07, 2013 10:47 AM ------      Message from: Ok Anis A      Created: Thu Dec 30, 2012 10:04 AM       Please charge for no show  ------

## 2013-02-08 ENCOUNTER — Inpatient Hospital Stay (HOSPITAL_COMMUNITY)
Admission: AD | Admit: 2013-02-08 | Discharge: 2013-02-08 | Disposition: A | Discharge status: Home / Self Care | Payer: 59 | Source: Ambulatory Visit | Attending: Gynecology | Admitting: Gynecology

## 2013-02-08 ENCOUNTER — Encounter (HOSPITAL_COMMUNITY): Payer: Self-pay | Admitting: *Deleted

## 2013-02-08 ENCOUNTER — Inpatient Hospital Stay (HOSPITAL_COMMUNITY): Payer: 59

## 2013-02-08 DIAGNOSIS — R109 Unspecified abdominal pain: Secondary | ICD-10-CM | POA: Insufficient documentation

## 2013-02-08 DIAGNOSIS — Z3490 Encounter for supervision of normal pregnancy, unspecified, unspecified trimester: Secondary | ICD-10-CM

## 2013-02-08 DIAGNOSIS — O99891 Other specified diseases and conditions complicating pregnancy: Secondary | ICD-10-CM | POA: Insufficient documentation

## 2013-02-08 LAB — HCG, SERUM, QUALITATIVE: Preg, Serum: POSITIVE — AB

## 2013-02-08 LAB — HCG, QUANTITATIVE, PREGNANCY: hCG, Beta Chain, Quant, S: 18745 m[IU]/mL — ABNORMAL HIGH (ref <5)

## 2013-02-08 NOTE — MAU Note (Signed)
Got up to walk from desk, broke into a cold sweat, had a sharp sudden onset of pain in lower abd/ pelvis.  Feels a heaviness when sits.

## 2013-02-08 NOTE — MAU Note (Signed)
Pt states she felt'bad sharp pain for about 1 hour" now pain is dull

## 2013-02-08 NOTE — MAU Provider Note (Signed)
History     CSN: [id number]  Arrival date and time: 02/08/13 1816   None     Chief Complaint  Patient presents with  . Abdominal Pain   HPI [name] is a 27 y.o. G1P0 at [redacted]w[redacted]d who presents to MAU today with complaint of sharp lower abdominal pain earlier today x 1 hour. The pain is now a dull ache. She denies vaginal bleeding or discharge. No N/V or fever.   OB History   Grav Para Term Preterm Abortions TAB SAB Ect Mult Living   1               Past Medical History  Diagnosis Date  . Herpes zoster 05/24/09  . Nausea and vomiting   . Migraines   . Generalized headaches   . Fatigue   . Depression     Past Surgical History  Procedure Laterality Date  . Augmentation mammaplasty  2007  . Wisdom tooth extraction  2007    Family History  Problem Relation Age of Onset  . Hypertension Father   . Cancer Maternal Grandmother     ovarian or uterine cancer and throat cancer  . Breast cancer Maternal Grandmother 16  . Lung cancer Paternal Grandfather     lung  . Lymphoma Paternal Grandfather   . Colon cancer Neg Hx     History  Substance Use Topics  . Smoking status: Never Smoker   . Smokeless tobacco: Never Used  . Alcohol Use: 0.0 oz/week    1-2 Glasses of wine per week     Comment: occas    Allergies: No Known Allergies  No prescriptions prior to admission    Review of Systems  Constitutional: Negative for fever and malaise/fatigue.  Gastrointestinal: Positive for abdominal pain. Negative for nausea and vomiting.  Genitourinary:       Neg - vaginal bleeding, discharge  Neurological: Negative for dizziness.   Physical Exam   Blood pressure 116/70, pulse 72, temperature 98 F (36.7 C), temperature source Oral, resp. rate 16, height 5\' 2"  (1.575 m), weight 179 lb (81.194 kg), last menstrual period 01/03/2013, SpO2 100.00%.  Physical Exam  Constitutional: She is oriented to person, place, and time. She appears well-developed and  well-nourished. No distress.  HENT:  Head: Normocephalic and atraumatic.  Cardiovascular: Normal rate.   Respiratory: Effort normal.  GI: Soft.  Neurological: She is alert and oriented to person, place, and time.  Skin: Skin is warm and dry. No erythema.  Psychiatric: She has a normal mood and affect.   Results for orders placed during the hospital encounter of 02/08/13 (from the past 24 hour(s))  HCG, SERUM, QUALITATIVE     Status: Abnormal   Collection Time    02/08/13  6:54 PM      Result Value Range   Preg, Serum POSITIVE (*) NEGATIVE  HCG, QUANTITATIVE, PREGNANCY     Status: Abnormal   Collection Time    02/08/13  8:20 PM      Result Value Range   hCG, Beta Chain, Quant, S 18745 (*) <5 mIU/mL   US Ob Comp Less 14 Wks  02/08/2013  *RADIOLOGY REPORT*  Clinical Data: Pelvic pain.  Positive pregnancy test.  HCG pending.  OBSTETRIC <14 WK [place] AND TRANSVAGINAL OB US  Technique:  Both transabdominal and transvaginal ultrasound examinations were performed for complete evaluation of the gestation as well as the maternal uterus, adnexal regions, and pelvic cul-de-sac.  Transvaginal technique was performed to assess early  pregnancy.  Comparison:  None.  Intrauterine gestational sac:  Single, normal in shape. Yolk sac: Present Embryo: None is seen Cardiac Activity: Not applicable MSD: 12.8 mm  6 w 0 d [place] EDC: 10/04/2013  Maternal uterus/adnexae: No subchorionic hemorrhage.  Normal right ovary.  Normal left ovary with corpus luteum cyst measuring 2.0 x 1.2 x 1.5 cm No free fluid.  IMPRESSION: Probable early intrauterine gestational sac, but no  fetal pole, or cardiac activity yet visualized.  Recommend follow-up quantitative B-HCG levels and follow-up US in 14 days to confirm and assess viability. This recommendation follows SRU consensus guidelines: Diagnostic Criteria for Nonviable Pregnancy Early in the First Trimester.  Malva Limes Med 2013; 161:0960-45.   Original Report Authenticated By: Davonna Belling, M.D.    US Ob Transvaginal  02/08/2013  *RADIOLOGY REPORT*  Clinical Data: Pelvic pain.  Positive pregnancy test.  HCG pending.  OBSTETRIC <14 WK [place] AND TRANSVAGINAL OB US  Technique:  Both transabdominal and transvaginal ultrasound examinations were performed for complete evaluation of the gestation as well as the maternal uterus, adnexal regions, and pelvic cul-de-sac.  Transvaginal technique was performed to assess early pregnancy.  Comparison:  None.  Intrauterine gestational sac:  Single, normal in shape. Yolk sac: Present Embryo: None is seen Cardiac Activity: Not applicable MSD: 12.8 mm  6 w 0 d [place] EDC: 10/04/2013  Maternal uterus/adnexae: No subchorionic hemorrhage.  Normal right ovary.  Normal left ovary with corpus luteum cyst measuring 2.0 x 1.2 x 1.5 cm No free fluid.  IMPRESSION: Probable early intrauterine gestational sac, but no  fetal pole, or cardiac activity yet visualized.  Recommend follow-up quantitative B-HCG levels and follow-up US in 14 days to confirm and assess viability. This recommendation follows SRU consensus guidelines: Diagnostic Criteria for Nonviable Pregnancy Early in the First Trimester.  Malva Limes Med 2013; 409:8119-14.   Original Report Authenticated By: Davonna Belling, M.D.     MAU Course  Procedures None  MDM Discussed patient with Dr. Audie Box. Will get [place] today.  Discussed [place] results with Dr. Audie Box. Patient ok for discharge. Follow-up with [facility] or OB provider of her choice.   Assessment and Plan  A: IUP at 6w 0d  Abdominal pain in pregnancy  P: Discharge home Patient encouraged to keep follow-up with [facility] OB[facility] as scheduled Bleeding precautions discussed Patient may return to MAU as needed or if her condition were to change or worsen  Freddi Starr, PA-C  02/08/2013, 10:31 PM

## 2013-02-08 NOTE — MAU Note (Signed)
Has appt tomorrow to get HCG level done

## 2013-02-08 NOTE — MAU Note (Signed)
Informed by lab that additional blood needed to be drawn for quantitative bhcg, qualitative performed. Pt informed that more specific were needed.

## 2013-02-08 NOTE — MAU Note (Signed)
Pt complaining of sharp pain in the lower abdomen 'at the top of my panty line straight cross the center"

## 2013-02-10 ENCOUNTER — Ambulatory Visit (INDEPENDENT_AMBULATORY_CARE_PROVIDER_SITE_OTHER): Payer: 59 | Admitting: Gynecology

## 2013-02-10 ENCOUNTER — Ambulatory Visit (INDEPENDENT_AMBULATORY_CARE_PROVIDER_SITE_OTHER): Payer: 59

## 2013-02-10 ENCOUNTER — Encounter: Payer: Self-pay | Admitting: Gynecology

## 2013-02-10 VITALS — BP 128/86

## 2013-02-10 DIAGNOSIS — O200 Threatened abortion: Secondary | ICD-10-CM

## 2013-02-10 DIAGNOSIS — O9989 Other specified diseases and conditions complicating pregnancy, childbirth and the puerperium: Secondary | ICD-10-CM

## 2013-02-10 DIAGNOSIS — O209 Hemorrhage in early pregnancy, unspecified: Secondary | ICD-10-CM

## 2013-02-10 DIAGNOSIS — N8300 Follicular cyst of ovary, unspecified side: Secondary | ICD-10-CM

## 2013-02-10 DIAGNOSIS — O4690 Antepartum hemorrhage, unspecified, unspecified trimester: Secondary | ICD-10-CM

## 2013-02-10 NOTE — Patient Instructions (Addendum)
Threatened Miscarriage Bleeding during the first 20 weeks of pregnancy is common. This is sometimes called a threatened miscarriage. This is a pregnancy that is threatening to end before the twentieth week of pregnancy. Often this bleeding stops with bed rest or decreased activities as suggested by your caregiver and the pregnancy continues without any more problems. You may be asked to not have sexual intercourse, have orgasms or use tampons until further notice. Sometimes a threatened miscarriage can progress to a complete or incomplete miscarriage. This may or may not require further treatment. Some miscarriages occur before a woman misses a menstrual period and knows she is pregnant. Miscarriages occur in 43 to 20% of all pregnancies and usually occur during the first 13 weeks of the pregnancy. The exact cause of a miscarriage is usually never known. A miscarriage is natures way of ending a pregnancy that is abnormal or would not make it to term. There are some things that may put you at risk to have a miscarriage, such as:  Hormone problems.  Infection of the uterus or cervix.  Chronic illness, diabetes for example, especially if it is not controlled.  Abnormal shaped uterus.  Fibroids in the uterus.  Incompetent cervix (the cervix is too weak to hold the baby).  Smoking.  Drinking too much alcohol. It's best not to drink any alcohol when you are pregnant.  Taking illegal drugs. TREATMENT  When a miscarriage becomes complete and all products of conception (all the tissue in the uterus) have been passed, often no treatment is needed. If you think you passed tissue, save it in a container and take it to your doctor for evaluation. If the miscarriage is incomplete (parts of the fetus or placenta remain in the uterus), further treatment may be needed. The most common reason for further treatment is continued bleeding (hemorrhage) because pregnancy tissue did not pass out of the uterus. This  often occurs if a miscarriage is incomplete. Tissue left behind may also become infected. Treatment usually is dilatation and curettage (the removal of the remaining products of pregnancy. This can be done by a simple sucking procedure (suction curettage) or a simple scraping of the inside of the uterus. This may be done in the hospital or in the caregiver's office. This is only done when your caregiver knows that there is no chance for the pregnancy to proceed to term. This is determined by physical examination, negative pregnancy test, falling pregnancy hormone count and/or, an ultrasound revealing a dead fetus. Miscarriages are often a very emotional time for prospective mothers and fathers. This is not you or your partners fault. It did not occur because of an inadequacy in you or your partner. Nearly all miscarriages occur because the pregnancy has started off wrongly. At least half of these pregnancies have a chromosomal abnormality. It is almost always not inherited. Others may have developmental problems with the fetus or placenta. This does not always show up even when the products miscarried are studied under the microscope. The miscarriage is nearly always not your fault and it is not likely that you could have prevented it from happening. If you are having emotional and grieving problems, talk to your health care provider and even seek counseling, if necessary, before getting pregnant again. You can begin trying for another pregnancy as soon as your caregiver says it is OK. HOME CARE INSTRUCTIONS   Your caregiver may order bed rest depending on how much bleeding and cramping you are having. You may be limited  caregiver may order bed rest depending on how much bleeding and cramping you are having. You may be limited to only getting up to go to the bathroom. You may be allowed to continue light activity. You may need to make arrangements for the care of your other children and for any other responsibilities.   Keep track of the number of pads you use each day, how often you have to change pads and how saturated  (soaked) they are. Record this information.   DO NOT USE TAMPONS. Do not douche, have sexual intercourse or orgasms until approved by your caregiver.   You may receive a follow up appointment for re-evaluation of your pregnancy and a repeat blood test. Re-evaluation often occurs after 2 days and again in 4 to 6 weeks. It is very important that you follow-up in the recommended time period.   If you are Rh negative and the father is Rh positive or you do not know the fathers' blood type, you may receive a shot (Rh immune globulin) to help prevent abnormal antibodies that can develop and affect the baby in any future pregnancies.  SEEK IMMEDIATE MEDICAL CARE IF:   You have severe cramps in your stomach, back, or abdomen.   You have a sudden onset of severe pain in the lower part of your abdomen.   You develop chills.   You run an unexplained temperature of 101 F (38.3 C) or higher.   You pass large clots or tissue. Save any tissue for your caregiver to inspect.   Your bleeding increases or you become light-headed, weak, or have fainting episodes.   You have a gush of fluid from your vagina.   You pass out. This could mean you have a tubal (ectopic) pregnancy.  Document Released: 09/15/2005 Document Revised: 12/08/2011 Document Reviewed: 05/01/2008   Exit[employer] Patient Information 2013 [employer].

## 2013-02-10 NOTE — Progress Notes (Signed)
Patient a 27 year old gravida 1 who was seen the office on May 7 when she found out that she was pregnant. Patient presented to the office today stating that she noticed some bleeding and some cramping.[facility] symptoms and had an ultrasound and only a gestational sac was seen. Her quantitative beta-hCG drawn in our office on May 7 had a value of 3065.7 and at the hospital on May 18 it had risen to 18,740 5 miu.   Exam: Abdomen: Soft nontender no rebound or guarding pelvic: Bartholin urethra Skene was within normal limits Vagina: Some dark blood was noted in the vaginal vault Cervix: No active bleeding noted Uterus: Six-week size anteverted nontender Adnexa: No palpable masses or tenderness Rectal exam: Not done  Ultrasound done in our office today demonstrated the following: Gestational sac was seen with crown-rump length seen size equals dates by last menstrual period placing the patient today at 5 weeks and 5 days. Fetal pole was seen with cardiac activity at 91 beats per minute. Subchorionic hematoma was seen near the cervix measuring 3.2 x 1.5 cm mixed echo vascular flow seen. The cervix was otherwise long closed. Right ovary was normal. Left ovary corpus luteum cyst measuring 2.4 x 1.8 cm was noted. Normal yolk sac was seen.    Assessment/plan:First trimester early pregnancy with small subchorionic hematoma. Rising quantitative beta-hCGs noted. Viable intrauterine pregnancy seen thus far. Threatened AB precautions were provided. We will check patient's blood type and Rh to determine if she needs RhoGAM or not. She was given a note to stay at her work for one week. She is an Training and development officer. She will limit any strenuous activity lifting or intercourse during the first trimester. She has started her prenatal vitamins. She will return to the office in 2 weeks for followup ultrasound.

## 2013-02-11 LAB — ABO AND RH: Rh Type: POSITIVE

## 2013-02-14 ENCOUNTER — Telehealth: Payer: Self-pay | Admitting: *Deleted

## 2013-02-14 MED ORDER — ONDANSETRON HCL 8 MG PO TABS: 8.0000 mg | ORAL_TABLET | Freq: Two times a day (BID) | ORAL | Status: DC | PRN

## 2013-02-14 NOTE — Telephone Encounter (Signed)
Pt is [redacted] weeks pregnant and c/o nausea and vomiting she can't keep fluids down, pt asked is there is any medication that she can be prescribed. Please advise

## 2013-02-14 NOTE — Telephone Encounter (Signed)
Pt voicemail is full, unable to leave message, will try later.

## 2013-02-14 NOTE — Telephone Encounter (Signed)
Please call and Zofran 8 mg one by mouth every 12 hours when necessary #10. Make sure that patient drinks gatorade to maintain her electrolytes. Reminder to take small multiple feedings throughout the day. If this does not help and she cannot keep anything down for 24 hour she needs to go to the emergency room at [facility] hospital where she may need IV hydration.

## 2013-02-14 NOTE — Telephone Encounter (Signed)
Pt informed with the below note, rx sent. 

## 2013-02-15 ENCOUNTER — Telehealth: Payer: Self-pay | Admitting: *Deleted

## 2013-02-15 NOTE — Telephone Encounter (Signed)
Pt informed KW 

## 2013-02-15 NOTE — Telephone Encounter (Signed)
Pt called yesterday bc of N/V in early pregnancy. Was prescribed Zofran 8mg . She took it yesterday and last night and was able to keep down 1 1/2 bottles of gatorade down. But this morning is not going well. Took Zofran this am and tried to eat and only got half a waffle down before vomiting several times this morning. Please advise what the next step is. As of today Zofran isnt working. PLs advise

## 2013-02-15 NOTE — Telephone Encounter (Signed)
Ariana Spencer, i am not sure if you got my prior message. If she cannot keep anything down she need to go to MAU at [facility] to be seen. She may need IV Hydration in the event she may be dehydrated.

## 2013-02-15 NOTE — Telephone Encounter (Signed)
Pt said she kept down 4 nuggets and a gatorade so feeling better but will keep [place] posted. KW

## 2013-02-16 ENCOUNTER — Other Ambulatory Visit: Payer: Self-pay | Admitting: Gynecology

## 2013-02-17 ENCOUNTER — Other Ambulatory Visit: Payer: Self-pay | Admitting: *Deleted

## 2013-02-17 MED ORDER — ONDANSETRON HCL 8 MG PO TABS: 8.0000 mg | ORAL_TABLET | Freq: Two times a day (BID) | ORAL | Status: DC | PRN

## 2013-02-23 ENCOUNTER — Ambulatory Visit (INDEPENDENT_AMBULATORY_CARE_PROVIDER_SITE_OTHER): Payer: 59

## 2013-02-23 ENCOUNTER — Ambulatory Visit (INDEPENDENT_AMBULATORY_CARE_PROVIDER_SITE_OTHER): Payer: 59 | Admitting: Gynecology

## 2013-02-23 DIAGNOSIS — N390 Urinary tract infection, site not specified: Secondary | ICD-10-CM

## 2013-02-23 DIAGNOSIS — N912 Amenorrhea, unspecified: Secondary | ICD-10-CM

## 2013-02-23 DIAGNOSIS — O4590 Premature separation of placenta, unspecified, unspecified trimester: Secondary | ICD-10-CM

## 2013-02-23 DIAGNOSIS — Z331 Pregnant state, incidental: Secondary | ICD-10-CM

## 2013-02-23 DIAGNOSIS — O219 Vomiting of pregnancy, unspecified: Secondary | ICD-10-CM

## 2013-02-23 DIAGNOSIS — O210 Mild hyperemesis gravidarum: Secondary | ICD-10-CM

## 2013-02-23 DIAGNOSIS — O4591 Premature separation of placenta, unspecified, first trimester: Secondary | ICD-10-CM

## 2013-02-23 DIAGNOSIS — Z3490 Encounter for supervision of normal pregnancy, unspecified, unspecified trimester: Secondary | ICD-10-CM

## 2013-02-23 DIAGNOSIS — O200 Threatened abortion: Secondary | ICD-10-CM

## 2013-02-23 LAB — US OB TRANSVAGINAL

## 2013-02-23 LAB — URINALYSIS W MICROSCOPIC + REFLEX CULTURE
Crystals: NONE SEEN
Ketones, ur: 15 mg/dL — AB
Protein, ur: 100 mg/dL — AB
Urobilinogen, UA: 1 mg/dL (ref 0.0–1.0)

## 2013-02-23 MED ORDER — ONDANSETRON HCL 8 MG PO TABS: 8.0000 mg | ORAL_TABLET | Freq: Two times a day (BID) | ORAL | Status: DC | PRN

## 2013-02-23 MED ORDER — AMPICILLIN 250 MG PO CAPS: ORAL_CAPSULE | ORAL | Status: DC

## 2013-02-23 NOTE — Progress Notes (Signed)
Patient a 27 year old gravida 1 who was seen the office on May 7 when she found out that she was pregnant.she returned back to the office on May 16 because she noticed some bleeding and cramping. 2 days prior to that office the patient gone to [facility] hospital with similar symptoms and had an ultrasound and only a gestational sac was seen.Her quantitative beta-hCG drawn in our office on May 7 had a value of 3065.7 and at the hospital on May 18 it had risen to 18,740 5 miu. The ultrasound done in our office on May 15 demonstrated the following:   Gestational sac was seen with crown-rump length seen size equals dates by last menstrual period placing the patient today at 5 weeks and 5 days. Fetal pole was seen with cardiac activity at 91 beats per minute. Subchorionic hematoma was seen near the cervix measuring 3.2 x 1.5 cm mixed echo vascular flow seen. The cervix was otherwise long closed. Right ovary was normal. Left ovary corpus luteum cyst measuring 2.4 x 1.8 cm was noted. Normal yolk sac was seen.  First trimester pregnancy with small subchorionic hematoma with the working diagnosis and threatened AB precautions were given. Her blood type and screen was a positive.  Patient presented to the office today for a followup ultrasound. Patient stated that she was having brownish discharge which less than what she was experiencing a few weeks ago. She has had issues with hyperemesis and had been prescribed Zofran 1 by mouth every 12 hours. Ultrasound today demonstrated the following:  Living intrauterine pregnancy was seen in the fundus of the uterus size less than dates by 2 days. Fetal pole and cardiac activity was noted. Fetal heart rate 156 beats per minute. Normal yolk sac was seen. Cervix is long closed. Continued presence of a small subchorionic hematoma near the cervix measuring 20 x 10 x 15 mm was noted. Ovaries otherwise unchanged.  Assessment/plan: First trimester viable intrauterine pregnancy at 7  weeks and 4 days with a small subchorionic hematoma appears to be resolving. Patient was reminded to take her prenatal vitamins daily. Also we discussed multiple small feedings and maintaining good hydration because of her hyperemesis. We did check her urine today and she was noted to have many bacteria 11-20 rbc's and 11-20 WBC. She will be placed on ampicillin 250 mg twice a day for 7 days for suspected UTI instead of waiting for her culture. Patient was refer to her colleagues at [facility] for obstetrical care. I have given her a copy of the ultrasound report and we will give her copy of this office note to take for her first prenatal visit which has already been scheduled within the next 2 weeks

## 2013-02-23 NOTE — Patient Instructions (Signed)
Hyperemesis Gravidarum  Hyperemesis gravidarum is a severe form of nausea and vomiting that happens during pregnancy. Hyperemesis is worse than morning sickness. It may cause a woman to have nausea or vomiting all day for many days. It may keep a woman from eating and drinking enough food and liquids. Hyperemesis usually occurs during the first half (the first 20 weeks) of pregnancy. It often goes away once a woman is in her second half of pregnancy. However, sometimes hyperemesis continues through an entire pregnancy.   CAUSES   The cause of this condition is not completely known but is thought to be due to changes in the body's hormones when pregnant. It could be the high level of the pregnancy hormone or an increase in estrogen in the body.   SYMPTOMS    Severe nausea and vomiting.   Nausea that does not go away.   Vomiting that does not allow you to keep any food down.   Weight loss and body fluid loss (dehydration).   Having no desire to eat or not liking food you have previously enjoyed.  DIAGNOSIS   Your caregiver may ask you about your symptoms. Your caregiver may also order blood tests and urine tests to make sure something else is not causing the problem.   TREATMENT   You may only need medicine to control the problem. If medicines do not control the nausea and vomiting, you will be treated in the hospital to prevent dehydration, acidosis, weight loss, and changes in the electrolytes in your body that may harm the unborn baby (fetus). You may need intravenous (IV) fluids.   HOME CARE INSTRUCTIONS    Take all medicine as directed by your caregiver.   Try eating a couple of dry crackers or toast in the morning before getting out of bed.   Avoid foods and smells that upset your stomach.   Avoid fatty and spicy foods. Eat 5 to 6 small meals a day.   Do not drink when eating meals. Drink between meals.   For snacks, eat high protein foods, such as cheese. Eat or suck on things that have ginger in  them. Ginger helps nausea.   Avoid food preparation. The smell of food can spoil your appetite.   Avoid iron pills and iron in your multivitamins until after 3 to 4 months of being pregnant.  SEEK MEDICAL CARE IF:    Your abdominal pain increases since the last time you saw your caregiver.   You have a severe headache.   You develop vision problems.   You feel you are losing weight.  SEEK IMMEDIATE MEDICAL CARE IF:    You are unable to keep fluids down.   You vomit blood.   You have constant nausea and vomiting.   You have a fever.   You have excessive weakness, dizziness, fainting, or extreme thirst.  MAKE SURE YOU:    Understand these instructions.   Will watch your condition.   Will get help right away if you are not doing well or get worse.  Document Released: 09/15/2005 Document Revised: 12/08/2011 Document Reviewed: 12/16/2010  ExitCare Patient Information 2014 [employer].

## 2013-03-30 LAB — OB RESULTS CONSOLE RPR: RPR: NONREACTIVE

## 2013-03-30 LAB — OB RESULTS CONSOLE RUBELLA ANTIBODY, IGM: RUBELLA: IMMUNE

## 2013-03-30 LAB — OB RESULTS CONSOLE HEPATITIS B SURFACE ANTIGEN: Hepatitis B Surface Ag: NEGATIVE

## 2013-03-30 LAB — OB RESULTS CONSOLE HIV ANTIBODY (ROUTINE TESTING): HIV: NONREACTIVE

## 2013-05-04 LAB — OB RESULTS CONSOLE GC/CHLAMYDIA
CHLAMYDIA, DNA PROBE: NEGATIVE
GC PROBE AMP, GENITAL: NEGATIVE

## 2013-07-14 LAB — OB RESULTS CONSOLE ABO/RH: RH Type: POSITIVE

## 2013-07-14 LAB — OB RESULTS CONSOLE RPR: RPR: NONREACTIVE

## 2013-07-14 LAB — OB RESULTS CONSOLE ANTIBODY SCREEN: Antibody Screen: NEGATIVE

## 2013-08-04 ENCOUNTER — Other Ambulatory Visit: Payer: Self-pay

## 2013-09-09 LAB — OB RESULTS CONSOLE GBS: GBS: NEGATIVE

## 2013-09-29 NOTE — L&D Delivery Note (Signed)
Delivery Note At 2:43 PM a viable and healthy female was delivered via Vaginal, Spontaneous Delivery (Presentation: ROA  ).  APGAR: 9, 9; weight pending.   Placenta status: Intact, Spontaneous.  Cord:  with the following complications: [place] x one reduced on perineum.  Cord pH: na  Anesthesia: Epidural  Episiotomy: none Lacerations: 2nd degree Suture Repair: 2.0 vicryl rapide Est. Blood Loss (mL): 300  Mom to postpartum.  Baby to Couplet care / Skin to Skin.  Sammantha Mehlhaff J 10/04/2013, 3:42 PM

## 2013-09-30 ENCOUNTER — Encounter (HOSPITAL_COMMUNITY): Payer: Self-pay | Admitting: *Deleted

## 2013-09-30 ENCOUNTER — Telehealth (HOSPITAL_COMMUNITY): Payer: Self-pay | Admitting: *Deleted

## 2013-09-30 NOTE — Telephone Encounter (Signed)
Preadmission screen  

## 2013-10-03 ENCOUNTER — Other Ambulatory Visit: Payer: Self-pay | Admitting: Obstetrics and Gynecology

## 2013-10-04 ENCOUNTER — Inpatient Hospital Stay (HOSPITAL_COMMUNITY): Payer: 59 | Admitting: Anesthesiology

## 2013-10-04 ENCOUNTER — Encounter (HOSPITAL_COMMUNITY): Payer: Self-pay

## 2013-10-04 ENCOUNTER — Inpatient Hospital Stay (HOSPITAL_COMMUNITY)
Admission: RE | Admit: 2013-10-04 | Discharge: 2013-10-06 | DRG: 775 | Disposition: A | Discharge status: Home / Self Care | Payer: 59 | Source: Ambulatory Visit | Attending: Obstetrics and Gynecology | Admitting: Obstetrics and Gynecology

## 2013-10-04 ENCOUNTER — Encounter (HOSPITAL_COMMUNITY): Payer: 59 | Admitting: Anesthesiology

## 2013-10-04 DIAGNOSIS — O409XX0 Polyhydramnios, unspecified trimester, not applicable or unspecified: Principal | ICD-10-CM | POA: Diagnosis present

## 2013-10-04 LAB — CBC
HCT: 35.7 % — ABNORMAL LOW (ref 36.0–46.0)
Hemoglobin: 12.3 g/dL (ref 12.0–15.0)
MCH: 29.1 pg (ref 26.0–34.0)
MCHC: 34.5 g/dL (ref 30.0–36.0)
MCV: 84.4 fL (ref 78.0–100.0)
PLATELETS: 174 10*3/uL (ref 150–400)
RBC: 4.23 MIL/uL (ref 3.87–5.11)
RDW: 13.1 % (ref 11.5–15.5)
WBC: 8.2 10*3/uL (ref 4.0–10.5)

## 2013-10-04 LAB — TYPE AND SCREEN
ABO/RH(D): A POS
ANTIBODY SCREEN: NEGATIVE

## 2013-10-04 LAB — ABO/RH: ABO/RH(D): A POS

## 2013-10-04 LAB — RPR: RPR: NONREACTIVE

## 2013-10-04 MED ORDER — DIPHENHYDRAMINE HCL 25 MG PO CAPS: 25.0000 mg | ORAL_CAPSULE | Freq: Four times a day (QID) | ORAL | Status: DC | PRN

## 2013-10-04 MED ORDER — TERBUTALINE SULFATE 1 MG/ML IJ SOLN: 0.2500 mg | Freq: Once | INTRAMUSCULAR | Status: DC | PRN

## 2013-10-04 MED ORDER — TETANUS-DIPHTH-ACELL PERTUSSIS 5-2.5-18.5 LF-MCG/0.5 IM SUSP: 0.5000 mL | Freq: Once | INTRAMUSCULAR | Status: DC

## 2013-10-04 MED ORDER — OXYTOCIN 40 UNITS IN LACTATED RINGERS INFUSION - SIMPLE MED
62.5000 mL/h | INTRAVENOUS | Status: DC
  Administered 2013-10-04: 62.5 mL/h via INTRAVENOUS

## 2013-10-04 MED ORDER — PHENYLEPHRINE 40 MCG/ML (10ML) SYRINGE FOR IV PUSH (FOR BLOOD PRESSURE SUPPORT): 80.0000 ug | PREFILLED_SYRINGE | INTRAVENOUS | Status: DC | PRN

## 2013-10-04 MED ORDER — ONDANSETRON HCL 4 MG/2ML IJ SOLN: 4.0000 mg | Freq: Four times a day (QID) | INTRAMUSCULAR | Status: DC | PRN

## 2013-10-04 MED ORDER — SIMETHICONE 80 MG PO CHEW: 80.0000 mg | CHEWABLE_TABLET | ORAL | Status: DC | PRN

## 2013-10-04 MED ORDER — LANOLIN HYDROUS EX OINT: TOPICAL_OINTMENT | CUTANEOUS | Status: DC | PRN

## 2013-10-04 MED ORDER — OXYCODONE-ACETAMINOPHEN 5-325 MG PO TABS: 1.0000 | ORAL_TABLET | ORAL | Status: DC | PRN

## 2013-10-04 MED ORDER — LIDOCAINE HCL (PF) 1 % IJ SOLN
30.0000 mL | INTRAMUSCULAR | Status: DC | PRN
  Filled 2013-10-04: qty 30

## 2013-10-04 MED ORDER — METHYLERGONOVINE MALEATE 0.2 MG/ML IJ SOLN: 0.2000 mg | INTRAMUSCULAR | Status: DC | PRN

## 2013-10-04 MED ORDER — FENTANYL 2.5 MCG/ML BUPIVACAINE 1/10 % EPIDURAL INFUSION (WH - ANES)
14.0000 mL/h | INTRAMUSCULAR | Status: DC | PRN
  Administered 2013-10-04: 14 mL/h via EPIDURAL
  Filled 2013-10-04: qty 125

## 2013-10-04 MED ORDER — LACTATED RINGERS IV SOLN: 500.0000 mL | INTRAVENOUS | Status: DC | PRN

## 2013-10-04 MED ORDER — WITCH HAZEL-GLYCERIN EX PADS: 1.0000 "application " | MEDICATED_PAD | CUTANEOUS | Status: DC | PRN

## 2013-10-04 MED ORDER — FLEET ENEMA 7-19 GM/118ML RE ENEM: 1.0000 | ENEMA | RECTAL | Status: DC | PRN

## 2013-10-04 MED ORDER — CITRIC ACID-SODIUM CITRATE 334-500 MG/5ML PO SOLN: 30.0000 mL | ORAL | Status: DC | PRN

## 2013-10-04 MED ORDER — OXYTOCIN 40 UNITS IN LACTATED RINGERS INFUSION - SIMPLE MED
1.0000 m[IU]/min | INTRAVENOUS | Status: DC
  Administered 2013-10-04: 2 m[IU]/min via INTRAVENOUS
  Filled 2013-10-04 (×2): qty 1000

## 2013-10-04 MED ORDER — IBUPROFEN 600 MG PO TABS: 600.0000 mg | ORAL_TABLET | Freq: Four times a day (QID) | ORAL | Status: DC | PRN

## 2013-10-04 MED ORDER — LACTATED RINGERS IV SOLN
INTRAVENOUS | Status: DC
  Administered 2013-10-04: 08:00:00 via INTRAVENOUS

## 2013-10-04 MED ORDER — OXYCODONE-ACETAMINOPHEN 5-325 MG PO TABS
1.0000 | ORAL_TABLET | ORAL | Status: DC | PRN
  Administered 2013-10-05: 1 via ORAL
  Filled 2013-10-04: qty 1

## 2013-10-04 MED ORDER — ONDANSETRON HCL 4 MG PO TABS
4.0000 mg | ORAL_TABLET | ORAL | Status: DC | PRN
Start: 2013-10-04 — End: 2013-10-05

## 2013-10-04 MED ORDER — METHYLERGONOVINE MALEATE 0.2 MG PO TABS: 0.2000 mg | ORAL_TABLET | ORAL | Status: DC | PRN

## 2013-10-04 MED ORDER — IBUPROFEN 600 MG PO TABS
600.0000 mg | ORAL_TABLET | Freq: Four times a day (QID) | ORAL | Status: DC
  Administered 2013-10-04 – 2013-10-06 (×8): 600 mg via ORAL
  Filled 2013-10-04 (×9): qty 1

## 2013-10-04 MED ORDER — ONDANSETRON HCL 4 MG/2ML IJ SOLN: 4.0000 mg | INTRAMUSCULAR | Status: DC | PRN

## 2013-10-04 MED ORDER — DIBUCAINE 1 % RE OINT: 1.0000 "application " | TOPICAL_OINTMENT | RECTAL | Status: DC | PRN

## 2013-10-04 MED ORDER — BENZOCAINE-MENTHOL 20-0.5 % EX AERO
1.0000 "application " | INHALATION_SPRAY | CUTANEOUS | Status: DC | PRN
  Administered 2013-10-04: 1 via TOPICAL
  Filled 2013-10-04: qty 56

## 2013-10-04 MED ORDER — EPHEDRINE 5 MG/ML INJ
10.0000 mg | INTRAVENOUS | Status: DC | PRN
  Filled 2013-10-04: qty 4

## 2013-10-04 MED ORDER — LACTATED RINGERS IV SOLN: 500.0000 mL | Freq: Once | INTRAVENOUS | Status: DC

## 2013-10-04 MED ORDER — SENNOSIDES-DOCUSATE SODIUM 8.6-50 MG PO TABS
2.0000 | ORAL_TABLET | ORAL | Status: DC
  Administered 2013-10-05 – 2013-10-06 (×2): 2 via ORAL
  Filled 2013-10-04 (×2): qty 2

## 2013-10-04 MED ORDER — EPHEDRINE 5 MG/ML INJ: 10.0000 mg | INTRAVENOUS | Status: DC | PRN

## 2013-10-04 MED ORDER — DIPHENHYDRAMINE HCL 50 MG/ML IJ SOLN: 12.5000 mg | INTRAMUSCULAR | Status: DC | PRN

## 2013-10-04 MED ORDER — ACETAMINOPHEN 325 MG PO TABS: 650.0000 mg | ORAL_TABLET | ORAL | Status: DC | PRN

## 2013-10-04 MED ORDER — PRENATAL MULTIVITAMIN CH
1.0000 | ORAL_TABLET | Freq: Every day | ORAL | Status: DC
  Administered 2013-10-05 – 2013-10-06 (×2): 1 via ORAL
  Filled 2013-10-04 (×2): qty 1

## 2013-10-04 MED ORDER — LIDOCAINE HCL (PF) 1 % IJ SOLN
INTRAMUSCULAR | Status: DC | PRN
  Administered 2013-10-04 (×2): 5 mL

## 2013-10-04 MED ORDER — PHENYLEPHRINE 40 MCG/ML (10ML) SYRINGE FOR IV PUSH (FOR BLOOD PRESSURE SUPPORT)
80.0000 ug | PREFILLED_SYRINGE | INTRAVENOUS | Status: DC | PRN
  Filled 2013-10-04: qty 10

## 2013-10-04 MED ORDER — OXYTOCIN BOLUS FROM INFUSION: 500.0000 mL | INTRAVENOUS | Status: DC

## 2013-10-04 MED ORDER — ZOLPIDEM TARTRATE 5 MG PO TABS: 5.0000 mg | ORAL_TABLET | Freq: Every evening | ORAL | Status: DC | PRN

## 2013-10-04 NOTE — Anesthesia Preprocedure Evaluation (Signed)
Anesthesia Evaluation  Patient identified by MRN, date of birth, ID band Patient awake    Reviewed: Allergy & Precautions, H&P , Patient's Chart, lab work & pertinent test results  Airway Mallampati: III TM Distance: >3 FB Neck ROM: full    Dental   Pulmonary  breath sounds clear to auscultation        Cardiovascular Rhythm:regular Rate:Normal     Neuro/Psych  Headaches,  Neuromuscular disease    GI/Hepatic GERD-  ,  Endo/Other    Renal/GU      Musculoskeletal   Abdominal   Peds  Hematology   Anesthesia Other Findings   Reproductive/Obstetrics (+) Pregnancy                           Anesthesia Physical Anesthesia Plan  ASA: II  Anesthesia Plan: Epidural   Post-op Pain Management:    Induction:   Airway Management Planned:   Additional Equipment:   Intra-op Plan:   Post-operative Plan:   Informed Consent: I have reviewed the patients History and Physical, chart, labs and discussed the procedure including the risks, benefits and alternatives for the proposed anesthesia with the patient or authorized representative who has indicated his/her understanding and acceptance.     Plan Discussed with:   Anesthesia Plan Comments:         Anesthesia Quick Evaluation

## 2013-10-04 NOTE — Anesthesia Procedure Notes (Signed)
Epidural Patient location during procedure: OB Start time: 10/04/2013 10:44 AM  Staffing Anesthesiologist: Brayton CavesJACKSON, Elizabethann Lackey Performed by: anesthesiologist   Preanesthetic Checklist Completed: patient identified, site marked, surgical consent, pre-op evaluation, timeout performed, IV checked, risks and benefits discussed and monitors and equipment checked  Epidural Patient position: sitting Prep: site prepped and draped and DuraPrep Patient monitoring: continuous pulse ox and blood pressure Approach: midline Injection technique: LOR air  Needle:  Needle type: Tuohy  Needle gauge: 17 G Needle length: 9 cm and 9 Needle insertion depth: 6 cm Catheter type: closed end flexible Catheter size: 19 Gauge Catheter at skin depth: 12 cm Test dose: negative  Assessment Events: blood not aspirated, injection not painful, no injection resistance, negative IV test and no paresthesia  Additional Notes Patient identified.  Risk benefits discussed including failed block, incomplete pain control, headache, nerve damage, paralysis, blood pressure changes, nausea, vomiting, reactions to medication both toxic or allergic, and postpartum back pain.  Patient expressed understanding and wished to proceed.  All questions were answered.  Sterile technique used throughout procedure and epidural site dressed with sterile barrier dressing. No paresthesia or other complications noted.The patient did not experience any signs of intravascular injection such as tinnitus or metallic taste in mouth nor signs of intrathecal spread such as rapid motor block. Please see nursing notes for vital signs.

## 2013-10-04 NOTE — Progress Notes (Signed)
Ariana Spencer is a 28 y.o. G1P0 at 72101w3d by LMP admitted for induction of labor due to Hydramnios.  Subjective: comfortable  Objective: BP 112/67  Pulse 67  Temp(Src) 97.5 F (36.4 C) (Oral)  Resp 18  Ht 5\' 2"  (1.575 m)  Wt 94.348 kg (208 lb)  BMI 38.03 kg/m2  SpO2 98%  LMP 01/01/2013      FHT:  FHR: 155 bpm, variability: moderate,  accelerations:  Present,  decelerations:  Absent UC:   regular, every 3 minutes SVE:   Dilation: 7 Effacement (%): 80 Station: 0;+1 Exam by:: Lynda RainwaterPamela Lawson RN +  Labs: Lab Results  Component Value Date   WBC 8.2 10/04/2013   HGB 12.3 10/04/2013   HCT 35.7* 10/04/2013   MCV 84.4 10/04/2013   PLT 174 10/04/2013    Assessment / Plan: Induction of labor due to polyhydramnios,  progressing well on pitocin  Labor: Progressing normally Preeclampsia:  no signs or symptoms of toxicity Fetal Wellbeing:  Category I Pain Control:  Epidural I/D:  n/a Anticipated MOD:  NSVD  Ariana Spencer 10/04/2013, 11:57 AM

## 2013-10-04 NOTE — Progress Notes (Signed)
Ariana CavesFreeman Jackson MD

## 2013-10-05 LAB — CBC
HCT: 34.9 % — ABNORMAL LOW (ref 36.0–46.0)
Hemoglobin: 11.7 g/dL — ABNORMAL LOW (ref 12.0–15.0)
MCH: 28.9 pg (ref 26.0–34.0)
MCHC: 33.5 g/dL (ref 30.0–36.0)
MCV: 86.2 fL (ref 78.0–100.0)
PLATELETS: 179 10*3/uL (ref 150–400)
RBC: 4.05 MIL/uL (ref 3.87–5.11)
RDW: 13.5 % (ref 11.5–15.5)
WBC: 11.4 10*3/uL — AB (ref 4.0–10.5)

## 2013-10-05 NOTE — Progress Notes (Signed)
Patient was referred for history of depression/anxiety. * Referral screened out by Clinical Social Worker because none of the following criteria appear to apply: ~ History of anxiety/depression during this pregnancy, or of post-partum depression. ~ Diagnosis of anxiety and/or depression within last 3 years ~ History of depression due to pregnancy loss/loss of child OR * Patient's symptoms currently being treated with medication and/or therapy.  PNR notes: "Better now, more situational" as far as Depression and Anxiety are concerned.  No current concerns mentioned.  Please contact the Clinical Social Worker if current concerns arise, or if patient requests. 

## 2013-10-05 NOTE — Progress Notes (Signed)
PPD 1 SVD  S:  Reports feeling Well             Tolerating po/ No nausea or vomiting             Bleeding is light             Pain controlled with motrin and percocet             Up ad lib / ambulatory / voiding QS  Newborn breast feeding  / Circumcision planned today  O:               VS: BP 135/70  Pulse 66  Temp(Src) 97.6 F (36.4 C) (Oral)  Resp 18  Ht 5\' 2"  (1.575 m)  Wt 94.348 kg (208 lb)  BMI 38.03 kg/m2  SpO2 98%  LMP 01/01/2013   LABS:              Recent Labs  10/04/13 0733 10/05/13 0630  WBC 8.2 11.4*  HGB 12.3 11.7*  PLT 174 179               Blood type: --/--/A POS, A POS (01/06 46960733)  Rubella: Immune (07/02 0000)                             Physical Exam:             Alert and oriented X3  Lungs: Clear and unlabored  Heart: regular rate and rhythm / no mumurs  Abdomen: soft, non-tender, non-distended              Fundus: firm, non-tender, U-1  Perineum: mild edema / ice in place  Lochia: light  Extremities: no edema, no calf pain or tenderness    A: PPD # 1   Doing well - stable status  P: Routine post partum orders  Anticipate DC tomorrow  Marlinda MikeBAILEY, Lovis More CNM, MSN, [facility] CenterFACNM 10/05/2013, 9:05 AM

## 2013-10-05 NOTE — Lactation Note (Signed)
This note was copied from the chart of Ariana Jerel Shepherdmanda Nitta. Lactation Consultation Note Initial Consult.  Baby Ariana 9 hours old.  Mother has edema surrounding her nipples.  Suggested mom prepump with hand pump prior to nursing.  Also gave mom some inverted nipple shields to help draw semi flat nipple out.  Mom has lots of colostrum with hand expression. BF basics reviewed with Mom, encouraged to BF with feeding ques or STS, approx. every 3 hours. Assisted Mom with positioning and latching baby in football hold. Baby very fussy.  Assisted with cross cradle and side lying positions.  No latch achieved.  Swaddled baby and he calmed down and suggested parents try when he shows feeding cues again.  Left lactation brochure and encouraged mother to call for further assistance and questions.    Patient Name: Ariana Spencer WUJWJ'XToday's Date: 10/05/2013     Maternal Data    Feeding    LATCH Score/Interventions                      Lactation Tools Discussed/Used  Hand pump Shells   Consult Status      Dahlia ByesBerkelhammer, Kylian Loh Spencer 10/05/2013, 12:10 AM

## 2013-10-05 NOTE — H&P (Signed)
NAMJerel Spencer:  Badley, [name]              ACCOUNT NO.:  [id number]  MEDICAL RECORD NO.:  [id number]  LOCATION:  9133                          FACILITY:  [facility]  PHYSICIAN:  Lenoard Adenichard J. Nyela Cortinas, M.D.DATE OF BIRTH:  [birth year]  DATE OF ADMISSION:  10/04/2013 DATE OF DISCHARGE:                             HISTORY & PHYSICAL   CHIEF COMPLAINT:  Polyhydramnios with favorable cervix for induction at 39 weeks.  HISTORY OF PRESENT ILLNESS:  She is a 28 year old white female, G1, P0 at 39+ weeks gestation who presents with aforementioned indications for induction.  ALLERGIES:  She has no known drug allergies.  MEDICATIONS:  Prenatal vitamins.  SOCIAL HISTORY:  She is a nonsmoker, nondrinker.  She denies domestic or physical violence.  PAST MEDICAL HISTORY:  Remarkable for breast implants in 2007.  SURGICAL HISTORY:  Otherwise, noncontributory.  FAMILY HISTORY:  Alzheimer disease and lung cancer.  This is her first pregnancy.  She has a personal history of previous history of depression, on no medications.  Her pregnancy course complicated by preterm cervical change at 30 weeks.  She does have a positive fetal fibronectin and was given betamethasone.  Her subsequent course has been complicated only by polyhydramnios and progressive cervical change, now at term.  PHYSICAL EXAMINATION:  GENERAL:  She is a well-developed, well- nourished, white female in no acute distress. HEENT:  Normal. NECK:  Supple.  Full range of motion. LUNGS:  Clear. HEART:  Regular rate and rhythm. ABDOMEN:  Soft, gravid, nontender.  Estimated fetal weight 7.5 pounds. Cervix is 4 cm, 80%, vertex, -1. EXTREMITIES:  There are no cords. NEUROLOGIC:  Nonfocal. SKIN:  Intact.  IMPRESSION: 1. A 39-week intrauterine pregnancy. 2. History of preterm labor. 3. Hydramnios.  PLAN:  Proceed with induction, amniotomy, Pitocin, and anticipate attempts at vaginal delivery.     Lenoard Adenichard J. Jinx Gilden, M.D.     RJT/MEDQ   D:  10/04/2013  T:  10/05/2013  Job:  [id number]

## 2013-10-05 NOTE — Anesthesia Postprocedure Evaluation (Signed)
  Anesthesia Post-op Note  Anesthesia Post Note  Patient: Ariana Spencer  Procedure(s) Performed: * No procedures listed *  Anesthesia type: Epidural  Patient location: Mother/Baby  Post pain: Pain level controlled  Post assessment: Post-op Vital signs reviewed  Last Vitals:  Filed Vitals:   10/05/13 0536  BP: 135/70  Pulse: 66  Temp: 36.4 C  Resp: 18    Post vital signs: Reviewed  Level of consciousness:alert  Complications: No apparent anesthesia complications

## 2013-10-06 MED ORDER — IBUPROFEN 600 MG PO TABS: 600.0000 mg | ORAL_TABLET | Freq: Four times a day (QID) | ORAL | Status: DC

## 2013-10-06 NOTE — Lactation Note (Signed)
This note was copied from the chart of Boy Jerel Shepherdmanda Hannum. Lactation Consultation Note  Patient Name: Boy Jerel Shepherdmanda Vohra WUJWJ'XToday's Date: 10/06/2013 Reason for consult: Follow-up assessment Mom concerned that baby gets upset and difficult to latch. Was attempting to latch without NS. Enc to use NS for now. Baby latched and nursed well with NS for 20 minutes. Reviewed BF basics, engorgement prevention/treatment, sore nipple treatment, and set-up OP appointment. Mom aware of [facility] services.   Maternal Data    Feeding Feeding Type: Breast Fed Length of feed: 20 min  LATCH Score/Interventions Latch: Grasps breast easily, tongue down, lips flanged, rhythmical sucking. Intervention(s): Skin to skin;Teach feeding cues Intervention(s): Assist with latch;Breast compression  Audible Swallowing: Spontaneous and intermittent Intervention(s): Skin to skin  Type of Nipple: Everted at rest and after stimulation  Comfort (Breast/Nipple): Filling, red/small blisters or bruises, mild/mod discomfort  Problem noted: Mild/Moderate discomfort Interventions (Filling): Hand pump Interventions  (Cracked/bleeding/bruising/blister): Hand pump Interventions (Mild/moderate discomfort): Hand massage;Pre-pump if needed  Hold (Positioning): Assistance needed to correctly position infant at breast and maintain latch. Intervention(s): Breastfeeding basics reviewed;Support Pillows  LATCH Score: 8  Lactation Tools Discussed/Used Tools: Comfort gels;Nipple Shields Nipple shield size: 20   Consult Status Consult Status: Follow-up Date: 10/12/13 Follow-up type: Out-patient    Geralynn OchsWILLIARD, Vang Kraeger 10/06/2013, 10:57 AM

## 2013-10-06 NOTE — Progress Notes (Signed)
PPD #2- SVD  Subjective:   Reports feeling well Tolerating po/ No nausea or vomiting Bleeding is light Pain controlled with Motrin Up ad lib / ambulatory / voiding without problems Newborn: breastfeeding  / Circumcision: done   Objective:   VS: VS:  Filed Vitals:   10/04/13 2143 10/05/13 0536 10/05/13 1800 10/06/13 0552  BP: 124/75 135/70 132/72 127/79  Pulse: 66 66 87 70  Temp: 98 F (36.7 C) 97.6 F (36.4 C) 97.9 F (36.6 C) 98.4 F (36.9 C)  TempSrc: Oral Oral Oral Oral  Resp: 18 18 18 18   Height:      Weight:      SpO2:        LABS:  Recent Labs  10/04/13 0733 10/05/13 0630  WBC 8.2 11.4*  HGB 12.3 11.7*  PLT 174 179   Blood type: --/--/A POS, A POS (01/06 0733) Rubella: Immune (07/02 0000)                I&O: Intake/Output     01/07 0701 - 01/08 0700 01/08 0701 - 01/09 0700   Blood     Total Output       Net              Physical Exam: Alert and oriented X3 Abdomen: soft, non-tender, non-distended  Fundus: firm, non-tender, U-1 Perineum: Well approximated, no significant erythema, edema, or drainage; healing well. Lochia: small Extremities: no edema, no calf pain or tenderness    Assessment: PPD # 2 G1P1001/ S/P:induced vaginal Doing well - stable for discharge home   Plan: Discharge home RX's:  Ibuprofen 600mg  po Q 6 hrs prn pain #30 Refill x 0 Routine pp visit in [facility] booklet given    Donette LarryBHAMBRI, Thales Knipple, N MSN, CNM 10/06/2013, 11:03 AM

## 2013-10-06 NOTE — Discharge Summary (Signed)
Obstetric Discharge Summary Reason for Admission: induction of labor Prenatal Procedures: NST, polyhydramnios, preterm cervical change at 30 wks Intrapartum Procedures: spontaneous vaginal delivery Postpartum Procedures: none Complications-Operative and Postpartum: 2nd degree perineal laceration Hemoglobin  Date Value Range Status  10/05/2013 11.7* 12.0 - 15.0 g/dL Final     HCT  Date Value Range Status  10/05/2013 34.9* 36.0 - 46.0 % Final    Physical Exam:  General: alert and cooperative Lochia: appropriate Uterine Fundus: firm Incision: healing well, no significant drainage, no dehiscence, no significant erythema DVT Evaluation: No evidence of DVT seen on physical exam. Negative Homan's sign. No cords or calf tenderness. No significant calf/ankle edema.  Discharge Diagnoses: Term Pregnancy-delivered  Discharge Information: Date: 10/06/2013 Activity: pelvic rest Diet: routine Medications: PNV and Ibuprofen Condition: stable Instructions: refer to practice specific booklet Discharge to: home Follow-up Information   Follow up with Lenoard AdenAAVON,RICHARD J, MD. Schedule an appointment as soon as possible for a visit in 6 weeks.   Specialty:  Obstetrics and Gynecology   Contact information:   [address] Severe[address] [place] [id number] [phone]       Newborn Data: Live born female on 10/04/2013 Birth Weight: 7 lb 5.1 oz (3320 g) APGAR: 9, 9  Home with mother.  Ariana Spencer, N 10/06/2013, 2:40 PM

## 2013-10-08 ENCOUNTER — Inpatient Hospital Stay (HOSPITAL_COMMUNITY): Admission: AD | Admit: 2013-10-08 | Payer: Self-pay | Source: Ambulatory Visit | Admitting: Obstetrics and Gynecology

## 2013-10-12 ENCOUNTER — Ambulatory Visit (HOSPITAL_COMMUNITY)
Admission: RE | Admit: 2013-10-12 | Discharge: 2013-10-12 | Disposition: A | Discharge status: Home / Self Care | Payer: 59 | Source: Ambulatory Visit | Attending: Obstetrics and Gynecology | Admitting: Obstetrics and Gynecology

## 2013-10-12 NOTE — Lactation Note (Addendum)
Infant Lactation Consultation Outpatient Visit Note  Patient Name: Ariana Spencer Date of Birth: 23-Aug-1986 Birth Weight:  7-5 oz  Gestational Age at Delivery: Gestational Age: <None> Type of Delivery: 10/04/2013 Vaginal Delivery , EBL 300 ml , Apgar 9 and 9 Implants 2007 , hx of Depression, anxiety  BW - 7-5 oz D/C weight - 6-13 oz  Last Friday ( 1st Dr. Visit ) - 6-11 oz  Mon. 1/12 - 7-10 oz  Reason for visit today - F/U due to use Nipple shield and sore nipples ( #20 NS )  Breastfeeding History- per mom has been feeding with a #20 NS in the hospital and at home  due to a difficult latch. Milk has been in , and the baby has been latching well , just sore Left >R Frequency of Breastfeeding: per mom feeding every 2 - 2 1 2  hours during the day ,  cluster feeding evenings, and stretching the feedings at night 4-5 hours. Length of Feeding: average feeding 15- 20 mins ( 6X's in 24 hours per mom )  Voids: 6- 8  Stools: 6 yellowish brown   Supplementing / Method: per mom no supplementing  Pumping:  Type of Pump:Medela Free style DEBP at home    Frequency:( hasn't used it yet has ben only hand expressing and using the manual pump from the hospital   Volume:  Mom did not say ( only when needed )   Comments: Hand pump and hand expressing working well     Consultation Evaluation: Both breast full , with nodules lateral aspects and the inner aspects, left >right  , Nipples appear sore left >right , Right pinky red , left small intact abrasion. After re- fitting mom for a larger size Nipple shield #24  And having the baby feed both breast - both nipples normal shape when the baby finished and appeared healthier .per mom comfortable  With feeding with the larger size nipple shield. Due to the baby's recessed chin, LC felt at this time the nipple shield is still indicated for the latch.  Baby latched well with depth both breast , multiply swallows and gulps noted both breast .  Due to sore  nipples , especially the left , LC suggested she call Dr. Jorene Minors office for "All Purpose Nipple Cream" as a preventive measure. LC did not notice signs of yeast , the left nipple is cracked , bleeding, excoriated.    Last fed at 0810 for 8 mins per mom.  Initial Feeding Assessment: Pre-feed Weight: 6-15.2 oz 3152 g  Post-feed Weight: 7-0.8 oz  3196 g  Amount Transferred: 44 ml  Comments: Right breast Marikay Alar /with #24 Nipple shield /  Sized mom 1st with the #20 Nipple shield and noticed the base was tight ( questionable causing the sore cracked nipples. #24 Nipple shield will be a better fit for the base of the nipple and areola. Hand expressed  6 ml off the breast prior to applying the nipple  Shield . Instilled the milk back into the nipple shield for an appetizer for the baby. Baby latched with depth and , lips needed to be adjusted  and flipped outward for a deeper latch. Baby fed for 25 mins in a consistent swallowing pattern with multiply swallows, increased with breast compressions. Baby softened the right breast well ( per mom comfortable with the larger nipple shield and the breast more comfortable) . When baby released the nipple  Appeared normal shape and less irritated .  Additional Feeding Assessment: Pre-feed Weight: 7-0.8 oz 3196 g  Post-feed Weight: 7-1.7 oz 3222 g  Amount Transferred: 26 ml  Comments:Left breast / cross cradle with #24 Nipple shield, baby latched with depth. LC worked with mom to obtain depth  With the breast compression technique until the baby was in a consistent pattern with swallows. Per mom comfortable. Feeding 10 mins. Baby content after feeding   Total Breast milk Transferred this Visit: 76 ml  Total Supplement Given: EBM = 6 ml as appetizer with syringe   Lactation Plan of Care-  Praised  mom for her breast feeding efforts and for dad and his support                                         -  Mom - Naps , rest , plenty fluids , esp H20 ,  Nutritious snacks and meals                                         - Sore nipple tx - Call Dr. Billy Coastaavon to ask for "All Purpose Nipple cream " -script .                                                                    - Expressed milk to nipples often , cold comfort gels, breast shells if needed                                        - Steps for latching - Breast massage , hand express, prepump if needed,                                                                           - Areola has to be sandwiched and compress able for a deep latch                                                                           - Apply Nipple shield #24 , use syringe with EBM in the top if needed, hand express.                                                                           -  Firm support , rotating positions                                         - Engorgement prevention and tx - Full breast = good sign , best time to feed , if the baby isn't hungry release breast                                                                                                    > full to firm - ICE for 15 - 20 mins and then steps.                                        - Psot pump after am feeding for 10 mins , save milk , midday , and PRN    Follow-Up - Per mom F/U with Dr. Roylene ReasonLet bek 10/18/2013 at 9 am                     - F/I lactation apt 10/20/2103 at 1030 am due to use Nipple shield and sore nipples       Kathrin Greathouseorio, Deloria Brassfield Ann 10/12/2013, 12:02 PM

## 2013-10-19 ENCOUNTER — Ambulatory Visit (HOSPITAL_COMMUNITY): Admission: RE | Admit: 2013-10-19 | Payer: 59 | Source: Ambulatory Visit

## 2013-10-26 ENCOUNTER — Encounter (HOSPITAL_COMMUNITY): Payer: Self-pay

## 2014-07-31 ENCOUNTER — Encounter (HOSPITAL_COMMUNITY): Payer: Self-pay

## 2016-09-29 NOTE — L&D Delivery Note (Signed)
Delivery Note  First Stage: Labor onset: 08/26/17 @ 0900am Augmentation : AROM Analgesia Eliezer Lofts/Anesthesia intrapartum: epidural  AROM at 0253AM  Second Stage: Complete dilation at 0254AM Onset of pushing at 0254AM FHR second stage Category 2, FHR 130s, occasional variable to nadir of 90-120 bpm with good recovery  Delivery of a viable late preterm female "Cadyn" at [id number] by Carlean JewsMeredith Sigmon, CNM in LOA position No nuchal cord Cord double clamped after cessation of pulsation, cut by FOB Cord blood sample collected   Third Stage: Placenta delivered via Tomasa BlaseSchultz intact with 3 VC @ 0428AM - many calcifications noted throughout Placenta disposition: pathology  Uterine tone firm with massage / bleeding minimal, few small clots expressed  2nd degree laceration with left labial extension identified  Anesthesia for repair: epidural and 1% Lidocaine 30mL Repair: 3.0 Vicryl in standard fashion with 2 additional interrupted sutures for extension Est. Blood Loss (mL): 300mL  Complications: none  Mom to postpartum.  Baby to Couplet care / Skin to Skin.  Newborn: Birth Weight: 6#14.6oz  Apgar Scores: 9, 9 Feeding planned: Breast   Carlean JewsMeredith Sigmon, MSN, CNM [facility]

## 2016-10-23 ENCOUNTER — Observation Stay (HOSPITAL_COMMUNITY)
Admission: AD | Admit: 2016-10-23 | Discharge: 2016-10-24 | Disposition: A | Discharge status: Home / Self Care | Payer: 59 | Attending: Psychiatry | Admitting: Psychiatry

## 2016-10-23 ENCOUNTER — Encounter (HOSPITAL_COMMUNITY): Payer: Self-pay

## 2016-10-23 DIAGNOSIS — Z8041 Family history of malignant neoplasm of ovary: Secondary | ICD-10-CM

## 2016-10-23 DIAGNOSIS — F329 Major depressive disorder, single episode, unspecified: Secondary | ICD-10-CM | POA: Insufficient documentation

## 2016-10-23 DIAGNOSIS — Z79899 Other long term (current) drug therapy: Secondary | ICD-10-CM

## 2016-10-23 DIAGNOSIS — Z8249 Family history of ischemic heart disease and other diseases of the circulatory system: Secondary | ICD-10-CM

## 2016-10-23 DIAGNOSIS — Z803 Family history of malignant neoplasm of breast: Secondary | ICD-10-CM

## 2016-10-23 DIAGNOSIS — F410 Panic disorder [episodic paroxysmal anxiety] without agoraphobia: Secondary | ICD-10-CM | POA: Diagnosis not present

## 2016-10-23 DIAGNOSIS — G4700 Insomnia, unspecified: Secondary | ICD-10-CM | POA: Diagnosis not present

## 2016-10-23 DIAGNOSIS — Z888 Allergy status to other drugs, medicaments and biological substances status: Secondary | ICD-10-CM

## 2016-10-23 DIAGNOSIS — Z9889 Other specified postprocedural states: Secondary | ICD-10-CM

## 2016-10-23 DIAGNOSIS — F411 Generalized anxiety disorder: Secondary | ICD-10-CM | POA: Diagnosis not present

## 2016-10-23 DIAGNOSIS — Z818 Family history of other mental and behavioral disorders: Secondary | ICD-10-CM

## 2016-10-23 DIAGNOSIS — Z801 Family history of malignant neoplasm of trachea, bronchus and lung: Secondary | ICD-10-CM | POA: Diagnosis not present

## 2016-10-23 MED ORDER — ACETAMINOPHEN 325 MG PO TABS
650.0000 mg | ORAL_TABLET | Freq: Four times a day (QID) | ORAL | Status: DC | PRN
  Administered 2016-10-23: 650 mg via ORAL
  Filled 2016-10-23: qty 2

## 2016-10-23 MED ORDER — MAGNESIUM HYDROXIDE 400 MG/5ML PO SUSP: 30.0000 mL | Freq: Every day | ORAL | Status: DC | PRN

## 2016-10-23 MED ORDER — SERTRALINE HCL 25 MG PO TABS
25.0000 mg | ORAL_TABLET | Freq: Every day | ORAL | Status: DC
  Administered 2016-10-23 – 2016-10-24 (×2): 25 mg via ORAL
  Filled 2016-10-23 (×2): qty 1

## 2016-10-23 MED ORDER — TRAZODONE HCL 50 MG PO TABS
50.0000 mg | ORAL_TABLET | Freq: Every evening | ORAL | Status: DC | PRN
  Administered 2016-10-23: 50 mg via ORAL
  Filled 2016-10-23: qty 1

## 2016-10-23 MED ORDER — HYDROXYZINE HCL 25 MG PO TABS
25.0000 mg | ORAL_TABLET | Freq: Four times a day (QID) | ORAL | Status: DC | PRN
  Administered 2016-10-23 – 2016-10-24 (×3): 25 mg via ORAL
  Filled 2016-10-23 (×3): qty 1

## 2016-10-23 MED ORDER — ALUM & MAG HYDROXIDE-SIMETH 200-200-20 MG/5ML PO SUSP: 30.0000 mL | ORAL | Status: DC | PRN

## 2016-10-23 NOTE — H&P (Signed)
BH Observation Unit Provider Admission PAA/H&P  Patient Identification: Ariana Spencer MRN:  [id number] Date of Evaluation:  10/23/2016 Chief Complaint:  MDD Principal Diagnosis: Generalized anxiety disorder  Diagnosis:   Patient Active Problem List   Diagnosis Date Noted  . Generalized anxiety disorder [F41.1] 10/23/2016  . Postpartum care following vaginal delivery (1/6) [Z39.2] 10/05/2013  . Pregnant [Z34.90] 02/23/2013  . Hyperemesis arising during pregnancy [O21.0] 02/23/2013  . Female pelvic pain [R10.2] 12/21/2012  . History of ovarian cyst [Z87.42] 12/21/2012  . Oily skin [R23.4] 12/21/2012  . Diarrhea [R19.7] 10/14/2011  . Nausea & vomiting [R11.2] 10/14/2011  . GERD (gastroesophageal reflux disease) [K21.9] 10/14/2011  . Herpes zoster [053]   . CONTACT DERMATITIS DUE TO POISON IVY [L25.5] 03/09/2009  . GASTROENTERITIS [K52.89] 10/19/2008  . MIGRAINE, CHRONIC [G43.909] 05/01/2008  . ACUTE MAXILLARY SINUSITIS [J01.00] 05/01/2008  . HEADACHE [R51] 05/01/2008  . ALLERGIC CONJUNCTIVITIS [H10.45] 01/26/2008  . OSTEOARTHROSIS, LOCAL NOS, LOWER LEG [M17.10] 05/14/2007  . MYALGIA [IMO0001] 03/10/2007   History of Present Illness: Ariana Spencer is a 31 year old female who [facility] for symptoms of uncontrollable anxiety and panic attacks.  Pt denies suicidal/homicidal ideation, denies auditory/visual hallucinations and does not appear to be responding to internal stimuli. Pt is anxious and tearful during assessment. Pt stated she has a history of anxiety and depression stemming back 10 years. Pt states "I used to be on Zoloft but decided to stop taking it because my anxiety got better and I felt that I knew the triggers and could control it. Lately my anxiety is increasing to the point of panic attacks. 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 Pt stated she has been trying to get pregnant for the past seven months but has been unsuccessful and is currently in the process of making OB appt to assess for infertility. Pt stated she believes this is a major source of her anxiety. Pt is willing to be admitted to OBS unit for medication management.  Associated Signs/Symptoms: Depression Symptoms:  depressed mood, difficulty concentrating, hopelessness, anxiety, panic attacks, (Hypo) Manic Symptoms:  excessive worry Anxiety Symptoms:  Excessive Worry, Panic Symptoms, Psychotic Symptoms:  N/A PTSD Symptoms: NA Total Time spent with patient: 20 minutes  Past Psychiatric History: depression, anxiety  Is the patient at risk to self? No.  Has the patient been a risk to self in the past 6 months? No.  Has the patient been a risk to self within the distant past? No.  Is the patient a risk to others? No.  Has the patient been a risk to others in the past 6 months? No.  Has the patient been a risk to others within the distant past? No.   Prior Inpatient Therapy:  None Prior Outpatient Therapy:  None  Alcohol Screening:   Substance Abuse History in the last 12 months:  No. Consequences of Substance Abuse: NA Previous Psychotropic Medications: No  Psychological Evaluations: No  Past Medical History:  Past Medical History:  Diagnosis Date  . Anxiety   . Depression   . Fatigue   . Generalized headaches   . History of shingles   . Migraines   . Nausea  and vomiting    Past Surgical History:  Procedure Laterality Date  . AUGMENTATION MAMMAPLASTY  2007  . WISDOM TOOTH EXTRACTION  2007   Family History:  Family History  Problem Relation Age of Onset  . Hypertension Father   . Cancer Maternal Grandmother     ovarian or uterine cancer and throat cancer  . Breast cancer Maternal Grandmother 51  . Lung cancer  Paternal Grandfather     lung  . Lymphoma Paternal Grandfather   . Cancer Paternal Grandfather     lung  . Colon cancer Neg Hx   . Cancer Maternal Grandfather     lung  . Alzheimer's disease Paternal Grandmother   . Bipolar disorder Paternal Uncle    Family Psychiatric History: unknown Tobacco Screening:   Social History:  History  Alcohol Use  . 0.0 oz/week  . 1 - 2 Glasses of wine per week    Comment: occas    History  Drug Use No    Additional Social History:    History of alcohol / drug use?: No history of alcohol / drug abuse  Allergies:  No Known Allergies Lab Results: No results found for this or any previous visit (from the past 48 hour(s)).  Blood Alcohol level:  No results found for: [facility]  Metabolic Disorder Labs:  No results found for: HGBA1C, MPG No results found for: PROLACTIN No results found for: CHOL, TRIG, HDL, CHOLHDL, VLDL, LDLCALC  Current Medications: Current Outpatient Prescriptions  Medication Sig Dispense Refill  . ibuprofen (ADVIL,MOTRIN) 600 MG tablet Take 1 tablet (600 mg total) by mouth every 6 (six) hours. 30 tablet 0  . Prenatal Vit-Fe Fumarate-FA (PRENATAL MULTIVITAMIN) TABS Take 1 tablet by mouth daily at 12 noon.     Current Facility-Administered Medications  Medication Dose Route Frequency Provider Last Rate Last Dose  . acetaminophen (TYLENOL) tablet 650 mg  650 mg Oral Q6H PRN Laveda Abbe, NP      . alum & mag hydroxide-simeth (MAALOX/MYLANTA) 200-200-20 MG/5ML suspension 30 mL  30 mL Oral Q4H PRN Laveda Abbe, NP      . hydrOXYzine (ATARAX/VISTARIL) tablet 25 mg  25 mg Oral Q6H PRN Laveda Abbe, NP      . magnesium hydroxide (MILK OF MAGNESIA) suspension 30 mL  30 mL Oral Daily PRN Laveda Abbe, NP      . sertraline (ZOLOFT) tablet 25 mg  25 mg Oral Daily Laveda Abbe, NP      . traZODone (DESYREL) tablet 50 mg  50 mg Oral QHS PRN Laveda Abbe, NP       PTA  Medications: None Musculoskeletal: Strength & Muscle Tone: within normal limits Gait & Station: normal Patient leans: N/A  Psychiatric Specialty Exam: Physical Exam  ROS  Blood pressure 121/71, pulse 62, temperature 98.6 F (37 C), temperature source Oral, resp. rate 18, SpO2 100 %, unknown if currently breastfeeding.There is no height or weight on file to calculate BMI.  General Appearance: Casual and Fairly Groomed  Eye Contact:  Good  Speech:  Clear and Coherent and Normal Rate  Volume:  Normal  Mood:  Anxious and Depressed  Affect:  Congruent and Depressed  Thought Process:  Coherent, Goal Directed and Linear  Orientation:  Full (Time, Place, and Person)  Thought Content:  Logical  Suicidal Thoughts:  No  Homicidal Thoughts:  No  Memory:  Immediate;   Good Recent;   Good Remote;   Fair  Judgement:  Good  Insight:  Good  Psychomotor Activity:  Normal  Concentration:  Concentration: Good and Attention Span: Good  Recall:  Good  Fund of Knowledge:  Good  Language:  Good  Akathisia:  No  Handed:  Right  AIMS (if indicated):     Assets:  Communication Skills Desire for Improvement Financial Resources/Insurance Housing Intimacy Leisure Time Physical Health Resilience Social Support Talents/Skills Transportation Vocational/Educational  ADL's:  Intact  Cognition:  WNL  Sleep:   Fair      Treatment Plan Summary: Daily contact with patient to assess and evaluate symptoms and progress in treatment and Medication management Vistaril 25 mg q6h prn Trazadone 50 mg qhs prn sleep Zoloft 25 mg QD anxiety/depression  Observation Level/Precautions:  Continuous Observation  Discharge Concerns:  Continued anxiety, infertility, finding a PCP, OB/GYN appts Estimated LOS:24-48 hours     Laveda Abbe, NP 1/25/201812:17 PM

## 2016-10-23 NOTE — BH Assessment (Signed)
Assessment Note  Darlina Rumpfmanda N Hanna is an 31 y.o. female that denies SI/HI/Psychosis/Substance Abuse.  Patient reports having frequent anxiety attacks associated with not becoming pregnant after 7 months of trying to get pregnant.   Patient was tearful throughout the assessment.   Patient reports that she has bee hospitalized in a psychiatric facility [facility].  Patient reports that her mother comitted suicide in 2000.  Patient reports that she stopped taking her psychiatric medication (Zoloft) six years ago.   Patiernt denies outpatient therapy.  Patient denies physical, sexual or emotional abuse.   Patient reports that she does have a three year old child and she has been married for six years.  Patient reports that she is employed and volunteers as a Programmer, systemscheerleading coach in the evening.    Diagnosis: Major Depressive Disorder and Anxiety Disorder   Past Medical History:  Past Medical History:  Diagnosis Date  . Anxiety   . Depression   . Fatigue   . Generalized headaches   . History of shingles   . Migraines   . Nausea and vomiting     Past Surgical History:  Procedure Laterality Date  . AUGMENTATION MAMMAPLASTY  2007  . WISDOM TOOTH EXTRACTION  2007    Family History:  Family History  Problem Relation Age of Onset  . Hypertension Father   . Cancer Maternal Grandmother     ovarian or uterine cancer and throat cancer  . Breast cancer Maternal Grandmother [id number]  . Lung cancer Paternal Grandfather     lung  . Lymphoma Paternal Grandfather   . Cancer Paternal Grandfather     lung  . Colon cancer Neg Hx   . Cancer Maternal Grandfather     lung  . Alzheimer's disease Paternal Grandmother   . Bipolar disorder Paternal Uncle     Social History:  reports that she has never smoked. She has never used smokeless tobacco. She reports that she drinks alcohol. She reports that she does not use drugs.  Additional Social History:  Alcohol / Drug Use History of alcohol /  drug use?: No history of alcohol / drug abuse  CIWA: CIWA-Ar BP: 121/71 Pulse Rate: 62 COWS:    Allergies: No Known Allergies  Home Medications:  (Not in a hospital admission)  OB/GYN Status:  No LMP recorded.  General Assessment Data Location of Assessment: [facility] Assessment Services (Walk In ) TTS Assessment: In system Is this a Tele or Face-to-Face Assessment?: Face-to-Face Is this an Initial Assessment or a Re-assessment for this encounter?: Initial Assessment Marital status: Married [place]: NA Is patient pregnant?: No Pregnancy Status: No Living Arrangements: Spouse/significant other Can pt return to current living arrangement?: Yes Admission Status: Voluntary Is patient capable of signing voluntary admission?: Yes Referral Source: Self/Family/Friend [facility]  Medical Screening Exam [facility] Walk-in ONLY) Medical Exam completed: Yes Jacki Cones(Laurie, NP - completed MSE Exam)  Crisis Care Plan Living Arrangements: Spouse/significant other Legal Guardian:  (NA) Name of Psychiatrist: None Reported Name of Therapist: None Reported  Education Status Is patient currently in school?: No Current Grade: NA Highest grade of school patient has completed: NA Name of school: NA Contact person: NA  Risk to self with the past 6 months Suicidal Ideation: No Has patient been a risk to self within the past 6 months prior to admission? : No Suicidal Intent: No Has patient had any suicidal intent within the past 6 months prior to admission? : No Is patient at risk for  suicide?: No Suicidal Plan?: No Has patient had any suicidal plan within the past 6 months prior to admission? : No Access to Means: No What has been your use of drugs/alcohol within the last 12 months?: NA Previous Attempts/Gestures: No How many times?: 0 Other Self Harm Risks: None Reported Triggers for Past Attempts: Other (Comment) (Having a hard time getting pregnant with 2nd child ) Intentional Self  Injurious Behavior: None Family Suicide History: Yes (Mothe rkilled herself in 2000) Recent stressful life event(s): Other (Comment) (Wants to have a second child ) Persecutory voices/beliefs?: No Depression: Yes Depression Symptoms: Despondent, Insomnia, Tearfulness, Isolating, Fatigue, Guilt, Loss of interest in usual pleasures, Feeling worthless/self pity, Feeling angry/irritable Substance abuse history and/or treatment for substance abuse?: No Suicide prevention information given to non-admitted patients: Not applicable  Risk to Others within the past 6 months Homicidal Ideation: No Does patient have any lifetime risk of violence toward others beyond the six months prior to admission? : No Thoughts of Harm to Others: No Current Homicidal Intent: No Current Homicidal Plan: No Access to Homicidal Means: No Describe Access to Homicidal Means: None Reported Identified Victim: None Reported History of harm to others?: No Assessment of Violence: None Noted Violent Behavior Description: None Reported Does patient have access to weapons?: No Criminal Charges Pending?: No Does patient have a court date: No Is patient on probation?: No  Psychosis Hallucinations: None noted Delusions: None noted  Mental Status Report Appearance/Hygiene: Disheveled Eye Contact: Poor Motor Activity: Freedom of movement Speech: Logical/coherent Level of Consciousness: Alert Mood: Depressed, Anxious Affect: Anxious Anxiety Level: Severe Thought Processes: Coherent, Relevant Judgement: Impaired Orientation: Person, Place, Time, Situation Obsessive Compulsive Thoughts/Behaviors: None  Cognitive Functioning Concentration: Decreased Memory: Remote Intact, Recent Intact IQ: Average Insight: Poor Impulse Control: Poor Appetite: Fair Weight Loss: 0 Weight Gain: 10 Sleep: Increased Total Hours of Sleep: 9 Vegetative Symptoms: Decreased grooming, Staying in bed  ADLScreening [facility] Assessment  Services) Patient's cognitive ability adequate to safely complete daily activities?: Yes Patient able to express need for assistance with ADLs?: Yes Independently performs ADLs?: Yes (appropriate for developmental age)  Prior Inpatient Therapy Prior Inpatient Therapy: Yes Prior Therapy Dates: 2010 Prior Therapy Facilty/Provider(s): [facility] Reason for Treatment: Depression and Anxiety  Prior Outpatient Therapy Prior Outpatient Therapy: Yes Prior Therapy Dates: 2012 Prior Therapy Facilty/Provider(s): Unable to remember the name of the facility  Reason for Treatment: Medication Management for anxiety Does patient have an ACCT team?: No Does patient have Intensive In-House Services?  : No Does patient have [facility] services? : No Does patient have P4CC services?: No  ADL Screening (condition at time of admission) Patient's cognitive ability adequate to safely complete daily activities?: Yes Is the patient deaf or have difficulty hearing?: No Does the patient have difficulty seeing, even when wearing glasses/contacts?: No Does the patient have difficulty concentrating, remembering, or making decisions?: No Patient able to express need for assistance with ADLs?: Yes Does the patient have difficulty dressing or bathing?: No Independently performs ADLs?: Yes (appropriate for developmental age) Does the patient have difficulty walking or climbing stairs?: No Weakness of Legs: None Weakness of Arms/Hands: None  Home Assistive Devices/Equipment Home Assistive Devices/Equipment: None    Abuse/Neglect Assessment (Assessment to be complete while patient is alone) Physical Abuse: Denies Verbal Abuse: Denies Sexual Abuse: Denies Exploitation of patient/patient's resources: Denies Self-Neglect: Denies Values / Beliefs Cultural Requests During Hospitalization: None Spiritual Requests During Hospitalization: None Consults Spiritual Care Consult Needed: No Social Work Consult Needed:  No Advance  Directives (For Healthcare) Does Patient Have a Medical Advance Directive?: No Would patient like information on creating a medical advance directive?: No - Patient declined    Additional Information 1:1 In Past 12 Months?: No CIRT Risk: No Elopement Risk: No Does patient have medical clearance?: Yes     Disposition: Per Jacki Cones, NP - patient meets criteria for the OBS Unit.  Disposition Initial Assessment Completed for this Encounter: Yes Disposition of Patient:  (Accepted to Observation Unit Bed 5)  On Site Evaluation by:   Reviewed with Physician:    Phillip Heal LaVerne 10/23/2016 12:34 PM

## 2016-10-23 NOTE — Progress Notes (Signed)
Admissioni Note: Admitted patient, a [age] y/o female, [facility].  [facility] symptoms of uncontrollable anxiety and panic attacks.  Pt denies suicidal/homicidal ideation, denies auditory/visual hallucinations and does not appear to be responding to internal stimuli. Patient appears anxious and is tearful on admission.  She reports that she has had a lot of stress in her life recently.  She said that she, her husband and her child have been moving and that she has also been trying unsuccessfully for seven months to get pregnant.  She states that she feels this is the main reason for her anxiety/depression. Skin assessment done and witnessed by two staff members with no abnormalities noted.  Patient belongings locked up in locker #31 by Security.  She denies any alcohol or drug use and reports that she does not smoke cigarettes.  She is alert and oriented x 4; affect sad; mood anxious/depressed.  She is pleasant and cooperative.  Emotional support provided; encouraged her to seek assistance with needs/concerns; patient oriented to unit and plan of care;  Lunch meal provided.

## 2016-10-23 NOTE — Progress Notes (Signed)
Patient in bed watching TV at the beginning of this shift. Her mood and affects flat and depressed. Although, she is very cooperative. She deied SI/HI and denied Hallucinations. She reported that her medication working and she is feeling better. Writer encouraged and supported patient. Offered patient HS snacks and beverages. Explained medication administration time to patient. Patient receptive to encouragement and supports. Safety maintained.

## 2016-10-23 NOTE — H&P (Signed)
Behavioral Health Medical Screening Exam  Ariana Spencer is an 31 y.o. female.  Total Time spent with patient: 20 minutes  Psychiatric Specialty Exam: Physical Exam  Constitutional: She is oriented to person, place, and time. She appears well-developed and well-nourished.  HENT:  Head: Normocephalic.  Neck: Normal range of motion.  Cardiovascular: Normal rate and normal heart sounds.   Respiratory: Effort normal and breath sounds normal.  GI: Soft. Bowel sounds are normal.  Musculoskeletal: Normal range of motion.  Neurological: She is alert and oriented to person, place, and time.  Skin: Skin is warm and dry.    Review of Systems  Psychiatric/Behavioral: Positive for depression. Negative for hallucinations, memory loss, substance abuse and suicidal ideas. The patient is nervous/anxious. The patient does not have insomnia.     Blood pressure 121/71, pulse 62, temperature 98.6 F (37 C), temperature source Oral, resp. rate 18, SpO2 100 %, unknown if currently breastfeeding.There is no height or weight on file to calculate BMI.  General Appearance: Casual and Fairly Groomed  Eye Contact:  Good  Speech:  Clear and Coherent and Normal Rate  Volume:  Normal  Mood:  Anxious and Depressed  Affect:  Congruent and Depressed  Thought Process:  Coherent, Goal Directed and Linear  Orientation:  Full (Time, Place, and Person)  Thought Content:  Logical  Suicidal Thoughts:  No  Homicidal Thoughts:  No  Memory:  Immediate;   Good Recent;   Good Remote;   Fair  Judgement:  Good  Insight:  Good  Psychomotor Activity:  Normal  Concentration: Concentration: Good and Attention Span: Good  Recall:  Good  Fund of Knowledge:Good  Language: Good  Akathisia:  No  Handed:  Right  AIMS (if indicated):     Assets:  Communication Skills Desire for Improvement Financial Resources/Insurance Housing Intimacy Leisure Time Physical Health Resilience Social  Support Talents/Skills Transportation Vocational/Educational  Sleep:       Musculoskeletal: Strength & Muscle Tone: within normal limits Gait & Station: normal Patient leans: N/A  Blood pressure 121/71, pulse 62, temperature 98.6 F (37 C), temperature source Oral, resp. rate 18, SpO2 100 %, unknown if currently breastfeeding.  Recommendations:  Based on my evaluation the patient does not appear to have an emergency medical condition.  Pt would benefit [facility] for crisis stabilization  Laveda AbbeLaurie Britton Parks, NP 10/23/2016, 12:10 PM

## 2016-10-24 DIAGNOSIS — F1099 Alcohol use, unspecified with unspecified alcohol-induced disorder: Secondary | ICD-10-CM

## 2016-10-24 DIAGNOSIS — F411 Generalized anxiety disorder: Secondary | ICD-10-CM | POA: Diagnosis not present

## 2016-10-24 MED ORDER — SERTRALINE HCL 25 MG PO TABS: 25.0000 mg | ORAL_TABLET | Freq: Every day | ORAL | 0 refills | Status: DC

## 2016-10-24 MED ORDER — HYDROXYZINE HCL 25 MG PO TABS: 25.0000 mg | ORAL_TABLET | Freq: Four times a day (QID) | ORAL | 0 refills | Status: DC | PRN

## 2016-10-24 MED ORDER — TRAZODONE HCL 50 MG PO TABS: 50.0000 mg | ORAL_TABLET | Freq: Every evening | ORAL | 0 refills | Status: DC | PRN

## 2016-10-24 NOTE — Discharge Planning (Signed)
[facility] Case Management Discharge Plan :  Will you be returning to the same living situation after discharge:  Yes,  Returning home  At discharge, do you have transportation home?: Yes,  Husband will provide transport  Do you have the ability to pay for your medications: Yes,  Insurance  Release of information consent forms completed and in the chart;  Patient's signature needed at discharge.  Patient to Follow up at: Follow-up Information    [facility]. Go in 3 day(s).   Why:  Patient has an appointment on the above listed date at 8:00am. Contact information: [address] [place], [place]           Safety Planning and Suicide Prevention discussed: Yes,  Patient verbalized understanding   Camelia EngKaren H Mordechai Matuszak 10/24/2016, 2:53 PM

## 2016-10-24 NOTE — Progress Notes (Addendum)
D:  Patient sleepy this am;  oriented x 4; shedenies suicidal and homicidal ideation and AVH; no self-injurious behaviors noted or reported. Affect sad; mood depressed/anxious A:  Emotional support provided; encouraged her to seek assistance with needs/concerns. R:  Safety maintained on unit.

## 2016-10-24 NOTE — Discharge Summary (Signed)
[facility]   Patient Identification: Ariana Spencer MRN:  [id number] Principal Diagnosis: Generalized anxiety disorder Diagnosis:   Patient Active Problem List   Diagnosis Date Noted  . Generalized anxiety disorder [F41.1] 10/23/2016    Priority: High  . Postpartum care following vaginal delivery (1/6) [Z39.2] 10/05/2013  . Pregnant [Z34.90] 02/23/2013  . Hyperemesis arising during pregnancy [O21.0] 02/23/2013  . Female pelvic pain [R10.2] 12/21/2012  . History of ovarian cyst [Z87.42] 12/21/2012  . Oily skin [R23.4] 12/21/2012  . Diarrhea [R19.7] 10/14/2011  . Nausea & vomiting [R11.2] 10/14/2011  . GERD (gastroesophageal reflux disease) [K21.9] 10/14/2011  . Herpes zoster [053]   . CONTACT DERMATITIS DUE TO POISON IVY [L25.5] 03/09/2009  . GASTROENTERITIS [K52.89] 10/19/2008  . MIGRAINE, CHRONIC [G43.909] 05/01/2008  . ACUTE MAXILLARY SINUSITIS [J01.00] 05/01/2008  . HEADACHE [R51] 05/01/2008  . ALLERGIC CONJUNCTIVITIS [H10.45] 01/26/2008  . OSTEOARTHROSIS, LOCAL NOS, LOWER LEG [M17.10] 05/14/2007  . MYALGIA [IMO0001] 03/10/2007    Total Time spent with patient: 30 minutes  Subjective:   Ariana Spencer is a 31 y.o. female patient admitted with reports of panic attacks and anxiety. Pt seen and chart reviewed. Pt is alert/oriented x4, calm, cooperative, and appropriate to situation. Pt denies suicidal/homicidal ideation and psychosis and does not appear to be responding to internal stimuli. Pt reports that she [facility] some good [facility] and feels much more stable in terms of a decrease in anxiety. Pt would like to discharge to outpatient.   HPI:  I have reviewed and concur with HPI elements below, modified as follows: "Ariana Spencer is a 31 year old female who [facility] for symptoms of uncontrollable anxiety and panic attacks.  Pt denies suicidal/homicidal ideation, denies auditory/visual hallucinations and does not appear to  be responding to internal stimuli. Pt is anxious and tearful during assessment. Pt stated she has a history of anxiety and depression stemming back 10 years. Pt states "I used to be on Zoloft but decided to stop taking it because my anxiety got better and I felt that I knew the triggers and could control it. Lately my anxiety is increasing to the point of panic attacks. 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 Pt stated she has been trying to get pregnant for the past seven months but has been unsuccessful and is currently in the process of making OB appt to assess for infertility. Pt stated she believes this is a major source of her anxiety. Pt is willing to be admitted to OBS unit for medication management. "  [facility]. He has an appointment with IOP in 3 days. Seen today for discharge evaluation as above on 10/24/16.   Past Psychiatric History: depression  Risk to Self: Suicidal Ideation: No Suicidal Intent: No Is patient at risk for suicide?: No Suicidal Plan?: No Access to Means: No What has been your use of drugs/alcohol within the last 12 months?: NA How many times?: 0 Other Self Harm Risks: None Reported Triggers for Past Attempts: Other (Comment) (Having a hard time getting pregnant with 2nd child ) Intentional Self Injurious Behavior: None Risk  to Others: Homicidal Ideation: No Thoughts of Harm to Others: No Current Homicidal Intent: No Current Homicidal Plan: No Access to Homicidal Means: No Describe Access to Homicidal Means: None Reported Identified Victim: None Reported History of harm to others?: No Assessment of Violence: None Noted Violent Behavior Description: None Reported Does patient have  access to weapons?: No Criminal Charges Pending?: No Does patient have a court date: No Prior Inpatient Therapy: Prior Inpatient Therapy: Yes Prior Therapy Dates: 2010 Prior Therapy Facilty/Provider(s): [facility] Reason for Treatment: Depression and Anxiety Prior Outpatient Therapy: Prior Outpatient Therapy: Yes Prior Therapy Dates: 2012 Prior Therapy Facilty/Provider(s): Unable to remember the name of the facility  Reason for Treatment: Medication Management for anxiety Does patient have an ACCT team?: No Does patient have Intensive In-House Services?  : No Does patient have [facility] services? : No Does patient have P4CC services?: No  Past Medical History:  Past Medical History:  Diagnosis Date  . Anxiety   . Depression   . Fatigue   . Generalized headaches   . History of shingles   . Migraines   . Nausea and vomiting     Past Surgical History:  Procedure Laterality Date  . AUGMENTATION MAMMAPLASTY  2007  . WISDOM TOOTH EXTRACTION  2007   Family History:  Family History  Problem Relation Age of Onset  . Hypertension Father   . Cancer Maternal Grandmother     ovarian or uterine cancer and throat cancer  . Breast cancer Maternal Grandmother 35  . Lung cancer Paternal Grandfather     lung  . Lymphoma Paternal Grandfather   . Cancer Paternal Grandfather     lung  . Cancer Maternal Grandfather     lung  . Alzheimer's disease Paternal Grandmother   . Bipolar disorder Paternal Uncle   . Colon cancer Neg Hx    Family Psychiatric  History: denies Social History:  History  Alcohol Use  . 0.0 oz/week  . 1 - 2 Glasses of wine per week    Comment: occas     History  Drug Use No    Social History   Social History  . Marital status: Single    Spouse name: N/A  . Number of children: N/A  . Years of education: N/A   Occupational History  . Cheer Coach  [employer]   Social History Main Topics  . Smoking status: Never Smoker  . Smokeless tobacco: Never  Used     Comment: Patient non-smoker  . Alcohol use 0.0 oz/week    1 - 2 Glasses of wine per week     Comment: occas  . Drug use: No  . Sexual activity: Yes   Other Topics Concern  . None   Social History Narrative  . None   Additional Social History:    Allergies:  No Known Allergies  Labs: No results found for this or any previous visit (from the past 48 hour(s)).  Current Facility-Administered Medications  Medication Dose Route Frequency Provider Last Rate Last Dose  . acetaminophen (TYLENOL) tablet 650 mg  650 mg Oral Q6H PRN Laveda Abbe, NP   650 mg at 10/23/16 1340  . alum & mag hydroxide-simeth (MAALOX/MYLANTA) 200-200-20 MG/5ML suspension 30 mL  30 mL Oral Q4H PRN Laveda Abbe, NP      . hydrOXYzine (ATARAX/VISTARIL) tablet 25 mg  25 mg Oral Q6H PRN Laveda Abbe, NP   25 mg at 10/24/16 0031  . magnesium hydroxide (MILK OF  MAGNESIA) suspension 30 mL  30 mL Oral Daily PRN Laveda Abbe, NP      . sertraline (ZOLOFT) tablet 25 mg  25 mg Oral Daily Laveda Abbe, NP   25 mg at 10/24/16 0737  . traZODone (DESYREL) tablet 50 mg  50 mg Oral QHS PRN Laveda Abbe, NP   50 mg at 10/23/16 2120    Musculoskeletal: Strength & Muscle Tone: within normal limits Gait & Station: normal Patient leans: N/A  Psychiatric Specialty Exam: Physical Exam  Review of Systems  Psychiatric/Behavioral: Positive for depression. The patient is nervous/anxious and has insomnia.   All other systems reviewed and are negative.   Blood pressure (!) 103/59, pulse 68, temperature 97.9 F (36.6 C), temperature source Oral, resp. rate 18, height 5\' 2"  (1.575 m), weight 86.2 kg (190 lb), last menstrual period 09/23/2016, SpO2 100 %, unknown if currently breastfeeding.Body mass index is 34.75 kg/m.  General Appearance: Casual and Fairly Groomed  Eye Contact:  Good  Speech:  Clear and Coherent and Normal Rate  Volume:  Decreased  Mood:  Depressed  Affect:   Congruent and Depressed  Thought Process:  Coherent, Goal Directed, Linear and Descriptions of Associations: Intact  Orientation:  Full (Time, Place, and Person)  Thought Content:  Focused on outpatient treatment plans  Suicidal Thoughts:  No  Homicidal Thoughts:  No  Memory:  Immediate;   Fair Recent;   Fair Remote;   Fair  Judgement:  Fair  Insight:  Fair  Psychomotor Activity:  Normal  Concentration:  Concentration: Fair and Attention Span: Fair  Recall:  [employer] of Knowledge:  Fair  Language:  Fair  Akathisia:  No  Handed:    AIMS (if indicated):     Assets:  Communication Skills Desire for Improvement Resilience Social Support  ADL's:  Intact  Cognition:  WNL  Sleep:       Treatment Plan Summary: Generalized anxiety disorder stable for outpatient management, treated as below:  Medications: -Vistaril 25mg  po q6h prn anxiety -Zoloft 25mg  po daily for depression -Trazodone 50mg  po qhs prn insomnia  Disposition: Discharge home and will go to IOP on 10/27/16 at 8am.  Beau Fanny, FNP 10/24/2016 12:22 PM

## 2016-10-24 NOTE — B[facility] Counselor (Signed)
This Clinical research associatewriter spoke with this pt who is requesting medication management and Outpatient therapy post discharge. This Clinical research associatewriter spoke with Baruch MerlEric Foushee in the [facility] who was able to link this pt to Donia GuilesJenny Edminson, Counselor in the department. The pt has an appointment on Monday, January 29, at 8:00am. This information was reported to pt. Pt was receptive.

## 2016-10-24 NOTE — Progress Notes (Signed)
[facility] OBSERVATION UNIT:  Family/Significant Other Suicide Prevention Education  Suicide Prevention Education:  Patient Refusal for Family/Significant Other Suicide Prevention Education: The patient Ariana Spencer has refused to provide written consent for family/significant other to be provided Family/Significant Other Suicide Prevention Education during admission and/or prior to discharge.  Physician notified.  Camelia EngKaren H Jolyn Deshmukh 10/24/2016, 10:57 AM   Camelia EngKaren H Tyreisha Ungar, RN 10/24/16  10:57 AM

## 2017-02-10 LAB — OB RESULTS CONSOLE ANTIBODY SCREEN: Antibody Screen: NEGATIVE

## 2017-02-10 LAB — OB RESULTS CONSOLE HIV ANTIBODY (ROUTINE TESTING): HIV: NONREACTIVE

## 2017-02-10 LAB — OB RESULTS CONSOLE HEPATITIS B SURFACE ANTIGEN: Hepatitis B Surface Ag: NEGATIVE

## 2017-02-10 LAB — OB RESULTS CONSOLE ABO/RH: RH Type: POSITIVE

## 2017-02-10 LAB — OB RESULTS CONSOLE RUBELLA ANTIBODY, IGM: RUBELLA: IMMUNE

## 2017-02-10 LAB — OB RESULTS CONSOLE RPR: RPR: NONREACTIVE

## 2017-02-16 ENCOUNTER — Inpatient Hospital Stay (HOSPITAL_COMMUNITY)
Admission: AD | Admit: 2017-02-16 | Discharge: 2017-02-16 | Disposition: A | Discharge status: Home / Self Care | Payer: Commercial Managed Care - HMO | Source: Ambulatory Visit | Attending: Obstetrics and Gynecology | Admitting: Obstetrics and Gynecology

## 2017-02-16 ENCOUNTER — Encounter (HOSPITAL_COMMUNITY): Payer: Self-pay | Admitting: *Deleted

## 2017-02-16 DIAGNOSIS — G43909 Migraine, unspecified, not intractable, without status migrainosus: Secondary | ICD-10-CM | POA: Diagnosis not present

## 2017-02-16 DIAGNOSIS — Z3A08 8 weeks gestation of pregnancy: Secondary | ICD-10-CM | POA: Diagnosis not present

## 2017-02-16 DIAGNOSIS — O99341 Other mental disorders complicating pregnancy, first trimester: Secondary | ICD-10-CM | POA: Insufficient documentation

## 2017-02-16 DIAGNOSIS — O210 Mild hyperemesis gravidarum: Secondary | ICD-10-CM | POA: Diagnosis present

## 2017-02-16 DIAGNOSIS — F419 Anxiety disorder, unspecified: Secondary | ICD-10-CM | POA: Insufficient documentation

## 2017-02-16 DIAGNOSIS — O26891 Other specified pregnancy related conditions, first trimester: Secondary | ICD-10-CM | POA: Diagnosis not present

## 2017-02-16 DIAGNOSIS — F329 Major depressive disorder, single episode, unspecified: Secondary | ICD-10-CM | POA: Insufficient documentation

## 2017-02-16 DIAGNOSIS — Z8619 Personal history of other infectious and parasitic diseases: Secondary | ICD-10-CM | POA: Diagnosis not present

## 2017-02-16 LAB — CBC
HEMATOCRIT: 43 % (ref 36.0–46.0)
Hemoglobin: 15 g/dL (ref 12.0–15.0)
MCH: 30.3 pg (ref 26.0–34.0)
MCHC: 34.9 g/dL (ref 30.0–36.0)
MCV: 86.9 fL (ref 78.0–100.0)
PLATELETS: 219 10*3/uL (ref 150–400)
RBC: 4.95 MIL/uL (ref 3.87–5.11)
RDW: 12.7 % (ref 11.5–15.5)
WBC: 10.5 10*3/uL (ref 4.0–10.5)

## 2017-02-16 LAB — URINALYSIS, ROUTINE W REFLEX MICROSCOPIC
Bilirubin Urine: NEGATIVE
Glucose, UA: NEGATIVE mg/dL
Hgb urine dipstick: NEGATIVE
KETONES UR: 20 mg/dL — AB
Nitrite: NEGATIVE
Protein, ur: NEGATIVE mg/dL
Specific Gravity, Urine: 1.021 (ref 1.005–1.030)
pH: 5 (ref 5.0–8.0)

## 2017-02-16 LAB — COMPREHENSIVE METABOLIC PANEL
ALBUMIN: 3.9 g/dL (ref 3.5–5.0)
ALT: 19 U/L (ref 14–54)
ANION GAP: 7 (ref 5–15)
AST: 37 U/L (ref 15–41)
Alkaline Phosphatase: 38 U/L (ref 38–126)
BUN: 13 mg/dL (ref 6–20)
CHLORIDE: 105 mmol/L (ref 101–111)
CO2: 22 mmol/L (ref 22–32)
Calcium: 9.1 mg/dL (ref 8.9–10.3)
Creatinine, Ser: 0.68 mg/dL (ref 0.44–1.00)
GFR calc Af Amer: >60 mL/min (ref >60)
GFR calc non Af Amer: >60 mL/min (ref >60)
Glucose, Bld: 99 mg/dL (ref 65–99)
POTASSIUM: 4.8 mmol/L (ref 3.5–5.1)
SODIUM: 134 mmol/L — AB (ref 135–145)
TOTAL PROTEIN: 7.3 g/dL (ref 6.5–8.1)
Total Bilirubin: 0.8 mg/dL (ref 0.3–1.2)

## 2017-02-16 MED ORDER — ONDANSETRON 4 MG PO TBDP: 4.0000 mg | ORAL_TABLET | Freq: Four times a day (QID) | ORAL | 2 refills | Status: DC | PRN

## 2017-02-16 MED ORDER — PROMETHAZINE HCL 25 MG/ML IJ SOLN
25.0000 mg | Freq: Four times a day (QID) | INTRAMUSCULAR | Status: DC | PRN
  Administered 2017-02-16: 25 mg via INTRAVENOUS
  Filled 2017-02-16: qty 1

## 2017-02-16 MED ORDER — ONDANSETRON HCL 4 MG/2ML IJ SOLN
4.0000 mg | Freq: Once | INTRAMUSCULAR | Status: AC
  Administered 2017-02-16: 4 mg via INTRAVENOUS
  Filled 2017-02-16: qty 2

## 2017-02-16 MED ORDER — DEXTROSE 5 % IN LACTATED RINGERS IV BOLUS
1000.0000 mL | Freq: Once | INTRAVENOUS | Status: AC
  Administered 2017-02-16: 1000 mL via INTRAVENOUS

## 2017-02-16 NOTE — MAU Note (Signed)
Is nauseous all the time.  Vomiting has picked up, is extremely thirsty- can't keep anything down.  (is on meds: Reglan, Diclegis and Zofran)

## 2017-02-16 NOTE — MAU Provider Note (Signed)
Chief Complaint: No chief complaint on file.   First Provider Initiated Contact with Patient 02/16/17 1814      SUBJECTIVE HPI: Ariana Spencer is a 31 y.o. G2P1001 at [id number] by LMP who presents to maternity admissions reporting nausea since early pregnancy but worsening with vomiting x 20 in 24 hours making her unable to keep anything down. She took Phenergan suppository last night, and is taking Diclegis, Zofran, and Reglan daily with no improvement in symptoms.  She reports thirst but is unable to keep down water.  There are no other associated symptoms.  Eating makes her symptoms worse, only sleep makes them better. She denies abdominal pain, vaginal bleeding, vaginal itching/burning, urinary symptoms, h/a, dizziness, n/v, or fever/chills.     HPI  Past Medical History:  Diagnosis Date  . Anxiety   . Depression   . Fatigue   . Generalized headaches   . History of shingles   . Migraines   . Nausea and vomiting    Past Surgical History:  Procedure Laterality Date  . AUGMENTATION MAMMAPLASTY  2007  . WISDOM TOOTH EXTRACTION  2007   Social History   Social History  . Marital status: Single    Spouse name: N/A  . Number of children: N/A  . Years of education: N/A   Occupational History  . Cheer Coach  [employer]   Social History Main Topics  . Smoking status: Never Smoker  . Smokeless tobacco: Never Used     Comment: Patient non-smoker  . Alcohol use 0.0 oz/week    1 - 2 Glasses of wine per week     Comment: occas  . Drug use: No  . Sexual activity: Yes   Other Topics Concern  . Not on file   Social History Narrative  . No narrative on file   No current facility-administered medications on file prior to encounter.    No current outpatient prescriptions on file prior to encounter.   No Known Allergies  ROS:  Review of Systems  Constitutional: Negative for chills, fatigue and fever.  Respiratory: Negative for shortness of breath.    Cardiovascular: Negative for chest pain.  Gastrointestinal: Positive for nausea and vomiting. Negative for constipation and diarrhea.  Genitourinary: Negative for difficulty urinating, dysuria, flank pain, pelvic pain, vaginal bleeding, vaginal discharge and vaginal pain.  Neurological: Negative for dizziness and headaches.  Psychiatric/Behavioral: Negative.      I have reviewed patient's Past Medical Hx, Surgical Hx, Family Hx, Social Hx, medications and allergies.   Physical Exam   Patient Vitals for the past 24 hrs:  BP Temp Temp src Pulse Resp SpO2 Weight  02/16/17 2130 (!) 113/56 - - 68 16 - -  02/16/17 1439 125/70 98.2 F (36.8 C) Oral 76 16 99 % 198 lb 4 oz (89.9 kg)   Constitutional: Well-developed, well-nourished female in no acute distress.  HEART: normal rate, heart sounds, regular rhythm RESP: normal effort, lung sounds clear and equal bilaterally GI: Abd soft, non-tender. Pos BS x 4 MS: Extremities nontender, no edema, normal ROM Neurologic: Alert and oriented x 4.  GU: Neg CVAT.  PELVIC EXAM: Deferred   LAB RESULTS Results for orders placed or performed during the hospital encounter of 02/16/17 (from the past 24 hour(s))  Urinalysis, Routine w reflex microscopic     Status: Abnormal   Collection Time: 02/16/17  2:40 PM  Result Value Ref Range   Color, Urine YELLOW YELLOW   APPearance HAZY (A) CLEAR  Specific Gravity, Urine 1.021 1.005 - 1.030   pH 5.0 5.0 - 8.0   Glucose, UA NEGATIVE NEGATIVE mg/dL   Hgb urine dipstick NEGATIVE NEGATIVE   Bilirubin Urine NEGATIVE NEGATIVE   Ketones, ur 20 (A) NEGATIVE mg/dL   Protein, ur NEGATIVE NEGATIVE mg/dL   Nitrite NEGATIVE NEGATIVE   Leukocytes, UA MODERATE (A) NEGATIVE   RBC / HPF 0-5 0 - 5 RBC/hpf   WBC, UA 6-30 0 - 5 WBC/hpf   Bacteria, UA RARE (A) NONE SEEN   Squamous Epithelial / LPF 6-30 (A) NONE SEEN   Mucous PRESENT   CBC     Status: None   Collection Time: 02/16/17  6:39 PM  Result Value Ref Range    WBC 10.5 4.0 - 10.5 K/uL   RBC 4.95 3.87 - 5.11 MIL/uL   Hemoglobin 15.0 12.0 - 15.0 g/dL   HCT 16.1 09.6 - 04.5 %   MCV 86.9 78.0 - 100.0 fL   MCH 30.3 26.0 - 34.0 pg   MCHC 34.9 30.0 - 36.0 g/dL   RDW 40.9 81.1 - 91.4 %   Platelets 219 150 - 400 K/uL  Comprehensive metabolic panel     Status: Abnormal   Collection Time: 02/16/17  6:39 PM  Result Value Ref Range   Sodium 134 (L) 135 - 145 mmol/L   Potassium 4.8 3.5 - 5.1 mmol/L   Chloride 105 101 - 111 mmol/L   CO2 22 22 - 32 mmol/L   Glucose, Bld 99 65 - 99 mg/dL   BUN 13 6 - 20 mg/dL   Creatinine, Ser 7.82 0.44 - 1.00 mg/dL   Calcium 9.1 8.9 - 95.6 mg/dL   Total Protein 7.3 6.5 - 8.1 g/dL   Albumin 3.9 3.5 - 5.0 g/dL   AST 37 15 - 41 U/L   ALT 19 14 - 54 U/L   Alkaline Phosphatase 38 38 - 126 U/L   Total Bilirubin 0.8 0.3 - 1.2 mg/dL   GFR calc non Af Amer >60 >60 mL/min   GFR calc Af Amer >60 >60 mL/min   Anion gap 7 5 - 15       IMAGING No results found.  MAU Management/MDM: Ordered labs (UA, CBC, CMP) and reviewed results.   IV fluids and Phenergan given, pt doing well, tolerating PO Consult Taavon with assessment and findings. D/C home with addition of ODT Zofran Rx, pt to use this and Phenergan suppositories as previously prescribed and continue Diclegis Pt vomited x1 prior to D/C so Zofran 4 mg IV given and pt reports improved nausea and is stable at time of discharge.    ASSESSMENT 1. Hyperemesis arising during pregnancy     PLAN Discharge home  Allergies as of 02/16/2017   No Known Allergies     Medication List    STOP taking these medications   hydrOXYzine 25 MG tablet Commonly known as:  ATARAX/VISTARIL   sertraline 25 MG tablet Commonly known as:  ZOLOFT   traZODone 50 MG tablet Commonly known as:  DESYREL     TAKE these medications   DICLEGIS 10-10 MG Tbec Generic drug:  Doxylamine-Pyridoxine Take 1-2 tablets by mouth as directed. Take 1 tablet iin the morning, take 1 tablet  during the afternoon and take 2 tablets at bedtime.   levothyroxine 50 MCG tablet Commonly known as:  SYNTHROID, LEVOTHROID Take 50 mcg by mouth daily.   metoCLOPramide 10 MG tablet Commonly known as:  REGLAN Take 10 mg by mouth every 8 (  eight) hours.   ondansetron 4 MG disintegrating tablet Commonly known as:  ZOFRAN ODT Take 1 tablet (4 mg total) by mouth every 6 (six) hours as needed for nausea.   ondansetron 4 MG tablet Commonly known as:  ZOFRAN Take 4 mg by mouth 3 (three) times daily as needed for nausea or vomiting.      Follow-up Information    Olivia Mackie, MD Follow up.   Specialty:  Obstetrics and Gynecology Why:  As scheduled, return to MAU as needed for emergencies Contact information: [address] [place] [address] [phone]           Sharen Counter Certified Nurse-Midwife 02/16/2017  9:47 PM

## 2017-02-18 LAB — CULTURE, OB URINE

## 2017-02-28 ENCOUNTER — Encounter (HOSPITAL_COMMUNITY): Payer: Self-pay

## 2017-02-28 ENCOUNTER — Inpatient Hospital Stay (HOSPITAL_COMMUNITY)
Admission: AD | Admit: 2017-02-28 | Discharge: 2017-02-28 | Disposition: A | Discharge status: Home / Self Care | Payer: Commercial Managed Care - HMO | Source: Ambulatory Visit | Attending: Obstetrics and Gynecology | Admitting: Obstetrics and Gynecology

## 2017-02-28 DIAGNOSIS — Z8619 Personal history of other infectious and parasitic diseases: Secondary | ICD-10-CM | POA: Diagnosis not present

## 2017-02-28 DIAGNOSIS — N888 Other specified noninflammatory disorders of cervix uteri: Secondary | ICD-10-CM

## 2017-02-28 DIAGNOSIS — Z3A10 10 weeks gestation of pregnancy: Secondary | ICD-10-CM | POA: Insufficient documentation

## 2017-02-28 DIAGNOSIS — O26891 Other specified pregnancy related conditions, first trimester: Secondary | ICD-10-CM | POA: Diagnosis present

## 2017-02-28 DIAGNOSIS — O209 Hemorrhage in early pregnancy, unspecified: Secondary | ICD-10-CM | POA: Diagnosis not present

## 2017-02-28 DIAGNOSIS — O98811 Other maternal infectious and parasitic diseases complicating pregnancy, first trimester: Secondary | ICD-10-CM | POA: Insufficient documentation

## 2017-02-28 DIAGNOSIS — B3731 Acute candidiasis of vulva and vagina: Secondary | ICD-10-CM

## 2017-02-28 DIAGNOSIS — B373 Candidiasis of vulva and vagina: Secondary | ICD-10-CM | POA: Diagnosis not present

## 2017-02-28 DIAGNOSIS — R1030 Lower abdominal pain, unspecified: Secondary | ICD-10-CM | POA: Diagnosis present

## 2017-02-28 LAB — URINALYSIS, ROUTINE W REFLEX MICROSCOPIC
BILIRUBIN URINE: NEGATIVE
Glucose, UA: NEGATIVE mg/dL
Ketones, ur: NEGATIVE mg/dL
Nitrite: NEGATIVE
PH: 5 (ref 5.0–8.0)
Protein, ur: NEGATIVE mg/dL
SPECIFIC GRAVITY, URINE: 1.015 (ref 1.005–1.030)

## 2017-02-28 LAB — WET PREP, GENITAL
CLUE CELLS WET PREP: NONE SEEN
SPERM: NONE SEEN
Trich, Wet Prep: NONE SEEN

## 2017-02-28 MED ORDER — TERCONAZOLE 0.4 % VA CREA: 1.0000 | TOPICAL_CREAM | Freq: Every day | VAGINAL | 0 refills | Status: DC

## 2017-02-28 NOTE — MAU Note (Signed)
Spotting when wipes yesterday- turned light brown and started again at 6:30 pm, dark red clots x 4 that were quarter size then like a light period when first arrived here and spotting when wiping in our bathroom.  Dull cramps in low abd since 5 pm. Yesterday before bleeding, had dr appt and heard baby heartbeat at 162.

## 2017-02-28 NOTE — Discharge Instructions (Signed)
Vaginal Bleeding During Pregnancy, First Trimester  A small amount of bleeding (spotting) from the vagina is relatively common in early pregnancy. It usually stops on its own. Various things may cause bleeding or spotting in early pregnancy. Some bleeding may be related to the pregnancy, and some may not. In most cases, the bleeding is normal and is not a problem. However, bleeding can also be a sign of something serious. Be sure to tell your health care provider about any vaginal bleeding right away.  Some possible causes of vaginal bleeding during the first trimester include:  · Infection or inflammation of the cervix.  · Growths (polyps) on the cervix.  · Miscarriage or threatened miscarriage.  · Pregnancy tissue has developed outside of the uterus and in a fallopian tube (tubal pregnancy).  · Tiny cysts have developed in the uterus instead of pregnancy tissue (molar pregnancy).    Follow these instructions at home:  Watch your condition for any changes. The following actions may help to lessen any discomfort you are feeling:  · Follow your health care provider's instructions for limiting your activity. If your health care provider orders bed rest, you may need to stay in bed and only get up to use the bathroom. However, your health care provider may allow you to continue light activity.  · If needed, make plans for someone to help with your regular activities and responsibilities while you are on bed rest.  · Keep track of the number of pads you use each day, how often you change pads, and how soaked (saturated) they are. Write this down.  · Do not use tampons. Do not douche.  · Do not have sexual intercourse or orgasms until approved by your health care provider.  · If you pass any tissue from your vagina, save the tissue so you can show it to your health care provider.  · Only take over-the-counter or prescription medicines as directed by your health care provider.  · Do not take aspirin because it can make  you bleed.  · Keep all follow-up appointments as directed by your health care provider.    Contact a health care provider if:  · You have any vaginal bleeding during any part of your pregnancy.  · You have cramps or labor pains.  · You have a fever, not controlled by medicine.  Get help right away if:  · You have severe cramps in your back or belly (abdomen).  · You pass large clots or tissue from your vagina.  · Your bleeding increases.  · You feel light-headed or weak, or you have fainting episodes.  · You have chills.  · You are leaking fluid or have a gush of fluid from your vagina.  · You pass out while having a bowel movement.  This information is not intended to replace advice given to you by your health care provider. Make sure you discuss any questions you have with your health care provider.  Document Released: 06/25/2005 Document Revised: 02/21/2016 Document Reviewed: 05/23/2013  Elsevier Interactive Patient Education © 2018 Elsevier Inc.    Pelvic Rest  Pelvic rest may be recommended if:  · Your placenta is partially or completely covering the opening of your cervix (placenta previa).  · There is bleeding between the wall of the uterus and the amniotic sac in the first trimester of pregnancy (subchorionic hemorrhage).  · You went into labor too early (preterm labor).    Based on your overall health and the health   of your baby, your health care provider will decide if pelvic rest is right for you.  How do I rest my pelvis?  For as long as told by your health care provider:  · Do not have sex, sexual stimulation, or an orgasm.  · Do not use tampons. Do not douche. Do not put anything in your vagina.  · Do not lift anything that is heavier than 10 lb (4.5 kg).  · Avoid activities that take a lot of effort (are strenuous).  · Avoid any activity in which your pelvic muscles could become strained.    When should I seek medical care?  Seek medical care if you have:  · Cramping pain in your lower  abdomen.  · Vaginal discharge.  · A low, dull backache.  · Regular contractions.  · Uterine tightening.    When should I seek immediate medical care?  Seek immediate medical care if:  · You have vaginal bleeding and you are pregnant.    This information is not intended to replace advice given to you by your health care provider. Make sure you discuss any questions you have with your health care provider.  Document Released: 01/10/2011 Document Revised: 02/21/2016 Document Reviewed: 03/19/2015  Elsevier Interactive Patient Education © 2018 Elsevier Inc.

## 2017-02-28 NOTE — MAU Provider Note (Signed)
Chief Complaint: Vaginal Bleeding   First Provider Initiated Contact with Patient 02/28/17 2027     SUBJECTIVE HPI: Ariana Spencer is a 31 y.o. G2P1001 at [redacted]w[redacted]d who presents to [facility] Ad[facility] reporting small amount of vaginal bleeding and low abdominal cramping since yesterday I got heavier today. Started passing small clots today. Gets prenatal care at [facility]. Reports normal ultrasound at 8 weeks. Was seen in office yesterday. States fetal heart rate was obtained by Doppler with difficulty.  Location: Suprapubic Quality: Cramping Severity: Mild-moderate Duration: 2 days Context: [redacted] weeks gestation. Live IUP previously confirmed. Course: Worsening Timing: Intermittent Modifying factors: Nothing. Hasn't tried anything for the pain. Associated signs and symptoms: Negative for fever, chills, passage of tissue, urinary complaints, vaginal discharge, vaginal irritation, vaginal odor or GI complaints. Positive for vaginal bleeding and passage of small clots.  Past Medical History:  Diagnosis Date  . Anxiety   . Depression   . Fatigue   . Generalized headaches   . History of shingles   . Migraines   . Nausea and vomiting    OB History  Gravida Para Term Preterm AB Living  2 1 1     1   SAB TAB Ectopic Multiple Live Births          1    # Outcome Date GA Lbr Len/2nd Weight Sex Delivery Anes PTL Lv  2 Current           1 Term 10/04/13 [redacted]w[redacted]d 05:11 / 01:07 7 lb 5.1 oz (3.32 kg) M Vag-Spont EPI  LIV     Birth Comments: Mom is 63 year old G1 Good health---hospitalized 5 years ago anxiety/depression. Has been fine for years Bedrest at 6 weeks for cervical blood clot Hyperemesis gravidarum---needed zofran Preterm labor at 28 weeks---bedrest and steroids No cigarettes, alcohol or drugs      Past Surgical History:  Procedure Laterality Date  . AUGMENTATION MAMMAPLASTY  2007  . WISDOM TOOTH EXTRACTION  2007   Social History   Social History  . Marital status:  Married    Spouse name: N/A  . Number of children: N/A  . Years of education: N/A   Occupational History  . Cheer Coach  [employer]   Social History Main Topics  . Smoking status: Never Smoker  . Smokeless tobacco: Never Used     Comment: Patient non-smoker  . Alcohol use 0.0 oz/week    1 - 2 Glasses of wine per week     Comment: occas  . Drug use: No  . Sexual activity: Yes   Other Topics Concern  . Not on file   Social History Narrative  . No narrative on file   Family History  Problem Relation Age of Onset  . Hypertension Father   . Cancer Maternal Grandmother        ovarian or uterine cancer and throat cancer  . Breast cancer Maternal Grandmother 92  . Lung cancer Paternal Grandfather        lung  . Lymphoma Paternal Grandfather   . Cancer Paternal Grandfather        lung  . Cancer Maternal Grandfather        lung  . Alzheimer's disease Paternal Grandmother   . Bipolar disorder Paternal Uncle   . Colon cancer Neg Hx    No current facility-administered medications on file prior to encounter.    Current Outpatient Prescriptions on File Prior to Encounter  Medication Sig Dispense Refill  . DICLEGIS  10-10 MG TBEC Take 1-2 tablets by mouth as directed. Take 1 tablet iin the morning, take 1 tablet during the afternoon and take 2 tablets at bedtime.  0  . levothyroxine (SYNTHROID, LEVOTHROID) 50 MCG tablet Take 50 mcg by mouth daily.  6  . metoCLOPramide (REGLAN) 10 MG tablet Take 10 mg by mouth every 8 (eight) hours.  0  . ondansetron (ZOFRAN ODT) 4 MG disintegrating tablet Take 1 tablet (4 mg total) by mouth every 6 (six) hours as needed for nausea. 20 tablet 2  . ondansetron (ZOFRAN) 4 MG tablet Take 4 mg by mouth 3 (three) times daily as needed for nausea or vomiting.   0   No Known Allergies  I have reviewed patient's Past Medical Hx, Surgical Hx, Family Hx, Social Hx, medications and allergies.   Review of Systems  Constitutional: Negative for  chills and fever.  Gastrointestinal: Positive for abdominal pain and nausea. Negative for constipation, diarrhea and vomiting.  Genitourinary: Positive for vaginal bleeding. Negative for dysuria, vaginal discharge and vaginal pain.    OBJECTIVE Patient Vitals for the past 24 hrs:  BP Temp Temp src Pulse Resp Weight  02/28/17 2140 121/71 98.1 F (36.7 C) Oral 64 16 -  02/28/17 1927 - - - - - 199 lb 12 oz (90.6 kg)   Constitutional: Well-developed, well-nourished female in no acute distress.  Cardiovascular: normal rate Respiratory: normal rate and effort.  GI: Abd soft, non-tender. Fundus non-palpable. MS: Extremities nontender, no edema, normal ROM Neurologic: Alert and oriented x 4.  GU:  SPECULUM EXAM: NEFG, physiologic discharge, small amount of dark red blood and tiny clot involved, cervix friable. No mucopurulent discharge. More consistent with ectropion and cervicitis.  BIMANUAL: cervix long and closed; uterus 10-week size, no adnexal tenderness or masses. No CMT.  LAB RESULTS Results for orders placed or performed during the hospital encounter of 02/28/17 (from the past 24 hour(s))  Urinalysis, Routine w reflex microscopic     Status: Abnormal   Collection Time: 02/28/17  7:22 PM  Result Value Ref Range   Color, Urine YELLOW YELLOW   APPearance HAZY (A) CLEAR   Specific Gravity, Urine 1.015 1.005 - 1.030   pH 5.0 5.0 - 8.0   Glucose, UA NEGATIVE NEGATIVE mg/dL   Hgb urine dipstick LARGE (A) NEGATIVE   Bilirubin Urine NEGATIVE NEGATIVE   Ketones, ur NEGATIVE NEGATIVE mg/dL   Protein, ur NEGATIVE NEGATIVE mg/dL   Nitrite NEGATIVE NEGATIVE   Leukocytes, UA MODERATE (A) NEGATIVE   RBC / HPF TOO NUMEROUS TO COUNT 0 - 5 RBC/hpf   WBC, UA 6-30 0 - 5 WBC/hpf   Bacteria, UA RARE (A) NONE SEEN   Squamous Epithelial / LPF 6-30 (A) NONE SEEN   Mucous PRESENT   Wet prep, genital     Status: Abnormal   Collection Time: 02/28/17  8:34 PM  Result Value Ref Range   Yeast Wet Prep  HPF POC PRESENT (A) NONE SEEN   Trich, Wet Prep NONE SEEN NONE SEEN   Clue Cells Wet Prep HPF POC NONE SEEN NONE SEEN   WBC, Wet Prep HPF POC MANY (A) NONE SEEN   Sperm NONE SEEN     IMAGING Informal bedside ultrasound shows FHR 167. Active fetus. Possible small subchorionic hemorrhage.   MAU COURSE Orders Placed This Encounter  Procedures  . Wet prep, genital  . Urinalysis, Routine w reflex microscopic   Discussed Hx, labs, exam w/ Dr. Tiburcio BashAlmquist. Agrees w/ POC. New orders: Send  GC/chlamydia cultures.   MDM - Small amount of vaginal bleeding and cramping likely due to friable cervix and possible subchorionic hemorrhage. Wet prep positive for yeast which may be contributing to friable cervix. Lotrimin Terazol 7.  No evidence of miscarriage in progress. Hemodynamically stable.  ASSESSMENT 1. First trimester bleeding   2. Friable cervix   3. Vaginal yeast infection     PLAN Discharge home in stable condition. Bleeding  Precautions Pelvic rest 1 week. GC/Chlamydia cultures pending.  Follow-up Information    Olivia Mackie, MD Follow up.   Specialty:  Obstetrics and Gynecology Why:  call to schedule appointment in one week Contact information: [address] [place] [address] [phone]        THE [facility] [place] MATERNITY ADMISSIONS Follow up.   Why:  as needed if symptoms worsen Contact information: [address] [id number] mc [place] [address] [phone]         Allergies as of 02/28/2017   No Known Allergies     Medication List    TAKE these medications   DICLEGIS 10-10 MG Tbec Generic drug:  Doxylamine-Pyridoxine Take 1-2 tablets by mouth as directed. Take 1 tablet iin the morning, take 1 tablet during the afternoon and take 2 tablets at bedtime.   levothyroxine 50 MCG tablet Commonly known as:  SYNTHROID, LEVOTHROID Take 50 mcg by mouth daily.   metoCLOPramide 10 MG tablet Commonly known as:   REGLAN Take 10 mg by mouth every 8 (eight) hours.   ondansetron 4 MG disintegrating tablet Commonly known as:  ZOFRAN ODT Take 1 tablet (4 mg total) by mouth every 6 (six) hours as needed for nausea.   ondansetron 4 MG tablet Commonly known as:  ZOFRAN Take 4 mg by mouth 3 (three) times daily as needed for nausea or vomiting.        Katrinka Blazing, [place], [place] 02/28/2017  9:29 PM

## 2017-03-02 LAB — GC/CHLAMYDIA PROBE AMP (~~LOC~~) NOT AT ARMC
Chlamydia: NEGATIVE
NEISSERIA GONORRHEA: NEGATIVE

## 2017-08-18 LAB — OB RESULTS CONSOLE GBS: STREP GROUP B AG: POSITIVE

## 2017-08-26 ENCOUNTER — Inpatient Hospital Stay (HOSPITAL_COMMUNITY): Payer: 59 | Admitting: Anesthesiology

## 2017-08-26 ENCOUNTER — Encounter (HOSPITAL_COMMUNITY): Payer: Self-pay | Admitting: *Deleted

## 2017-08-26 ENCOUNTER — Inpatient Hospital Stay (HOSPITAL_COMMUNITY)
Admission: AD | Admit: 2017-08-26 | Discharge: 2017-08-26 | Disposition: A | Discharge status: Home / Self Care | Payer: 59 | Source: Ambulatory Visit | Attending: Obstetrics & Gynecology | Admitting: Obstetrics & Gynecology

## 2017-08-26 ENCOUNTER — Inpatient Hospital Stay (HOSPITAL_COMMUNITY)
Admission: AD | Admit: 2017-08-26 | Discharge: 2017-08-28 | DRG: 807 | Disposition: A | Discharge status: Home / Self Care | Payer: 59 | Source: Ambulatory Visit | Attending: Obstetrics and Gynecology | Admitting: Obstetrics and Gynecology

## 2017-08-26 DIAGNOSIS — O99284 Endocrine, nutritional and metabolic diseases complicating childbirth: Secondary | ICD-10-CM | POA: Diagnosis present

## 2017-08-26 DIAGNOSIS — O99824 Streptococcus B carrier state complicating childbirth: Secondary | ICD-10-CM | POA: Diagnosis present

## 2017-08-26 DIAGNOSIS — Z3A36 36 weeks gestation of pregnancy: Secondary | ICD-10-CM

## 2017-08-26 DIAGNOSIS — E039 Hypothyroidism, unspecified: Secondary | ICD-10-CM | POA: Diagnosis present

## 2017-08-26 DIAGNOSIS — O4703 False labor before 37 completed weeks of gestation, third trimester: Secondary | ICD-10-CM

## 2017-08-26 DIAGNOSIS — E860 Dehydration: Secondary | ICD-10-CM

## 2017-08-26 HISTORY — DX: Hypothyroidism, unspecified: E03.9

## 2017-08-26 LAB — CBC
HEMATOCRIT: 38.8 % (ref 36.0–46.0)
HEMATOCRIT: 37.6 % (ref 36.0–46.0)
HEMOGLOBIN: 12.6 g/dL (ref 12.0–15.0)
HEMOGLOBIN: 12.2 g/dL (ref 12.0–15.0)
MCH: 27.8 pg (ref 26.0–34.0)
MCH: 27.9 pg (ref 26.0–34.0)
MCHC: 32.5 g/dL (ref 30.0–36.0)
MCHC: 32.4 g/dL (ref 30.0–36.0)
MCV: 85.7 fL (ref 78.0–100.0)
MCV: 85.8 fL (ref 78.0–100.0)
Platelets: 207 10*3/uL (ref 150–400)
Platelets: 222 10*3/uL (ref 150–400)
RBC: 4.53 MIL/uL (ref 3.87–5.11)
RBC: 4.38 MIL/uL (ref 3.87–5.11)
RDW: 13.3 % (ref 11.5–15.5)
RDW: 13.2 % (ref 11.5–15.5)
WBC: 9.7 10*3/uL (ref 4.0–10.5)
WBC: 10.3 10*3/uL (ref 4.0–10.5)

## 2017-08-26 LAB — COMPREHENSIVE METABOLIC PANEL
ALBUMIN: 2.8 g/dL — AB (ref 3.5–5.0)
ALT: 12 U/L — ABNORMAL LOW (ref 14–54)
AST: 17 U/L (ref 15–41)
Alkaline Phosphatase: 157 U/L — ABNORMAL HIGH (ref 38–126)
Anion gap: 10 (ref 5–15)
BUN: 10 mg/dL (ref 6–20)
CHLORIDE: 106 mmol/L (ref 101–111)
CO2: 17 mmol/L — ABNORMAL LOW (ref 22–32)
Calcium: 8.9 mg/dL (ref 8.9–10.3)
Creatinine, Ser: 0.59 mg/dL (ref 0.44–1.00)
GFR calc Af Amer: >60 mL/min (ref >60)
Glucose, Bld: 78 mg/dL (ref 65–99)
POTASSIUM: 4.2 mmol/L (ref 3.5–5.1)
SODIUM: 133 mmol/L — AB (ref 135–145)
Total Bilirubin: 0.6 mg/dL (ref 0.3–1.2)
Total Protein: 6.3 g/dL — ABNORMAL LOW (ref 6.5–8.1)

## 2017-08-26 LAB — URINALYSIS, ROUTINE W REFLEX MICROSCOPIC
Bilirubin Urine: NEGATIVE
Glucose, UA: NEGATIVE mg/dL
Ketones, ur: 20 mg/dL — AB
Nitrite: NEGATIVE
Protein, ur: 100 mg/dL — AB
SPECIFIC GRAVITY, URINE: 1.026 (ref 1.005–1.030)
pH: 5 (ref 5.0–8.0)

## 2017-08-26 LAB — PROTEIN / CREATININE RATIO, URINE
Creatinine, Urine: 364 mg/dL
PROTEIN CREATININE RATIO: 0.11 mg/mg{creat} (ref 0.00–0.15)
TOTAL PROTEIN, URINE: 39 mg/dL

## 2017-08-26 LAB — TYPE AND SCREEN
ABO/RH(D): A POS
ANTIBODY SCREEN: NEGATIVE

## 2017-08-26 LAB — GROUP B STREP BY PCR: GROUP B STREP BY PCR: POSITIVE — AB

## 2017-08-26 LAB — URIC ACID: URIC ACID, SERUM: 5.8 mg/dL (ref 2.3–6.6)

## 2017-08-26 MED ORDER — PENICILLIN G POT IN DEXTROSE 60000 UNIT/ML IV SOLN
3.0000 10*6.[IU] | INTRAVENOUS | Status: DC
  Administered 2017-08-27: 3 10*6.[IU] via INTRAVENOUS
  Filled 2017-08-26 (×4): qty 50

## 2017-08-26 MED ORDER — ONDANSETRON HCL 4 MG/2ML IJ SOLN
4.0000 mg | Freq: Once | INTRAMUSCULAR | Status: AC
  Administered 2017-08-26: 4 mg via INTRAVENOUS
  Filled 2017-08-26: qty 2

## 2017-08-26 MED ORDER — OXYCODONE-ACETAMINOPHEN 5-325 MG PO TABS: 2.0000 | ORAL_TABLET | ORAL | Status: DC | PRN

## 2017-08-26 MED ORDER — FENTANYL 2.5 MCG/ML BUPIVACAINE 1/10 % EPIDURAL INFUSION (WH - ANES)
14.0000 mL/h | INTRAMUSCULAR | Status: DC | PRN
  Administered 2017-08-26: 14 mL/h via EPIDURAL
  Filled 2017-08-26 (×2): qty 100

## 2017-08-26 MED ORDER — FENTANYL 2.5 MCG/ML BUPIVACAINE 1/10 % EPIDURAL INFUSION (WH - ANES): 14.0000 mL/h | INTRAMUSCULAR | Status: DC | PRN

## 2017-08-26 MED ORDER — DEXTROSE 5 % IV SOLN
5.0000 10*6.[IU] | Freq: Once | INTRAVENOUS | Status: AC
  Administered 2017-08-26: 5 10*6.[IU] via INTRAVENOUS
  Filled 2017-08-26: qty 5

## 2017-08-26 MED ORDER — SOD CITRATE-CITRIC ACID 500-334 MG/5ML PO SOLN
30.0000 mL | ORAL | Status: DC | PRN
  Administered 2017-08-27: 30 mL via ORAL
  Filled 2017-08-26: qty 15

## 2017-08-26 MED ORDER — BUTORPHANOL TARTRATE 1 MG/ML IJ SOLN
1.0000 mg | Freq: Once | INTRAMUSCULAR | Status: AC
Start: 2017-08-26 — End: 2017-08-26
  Administered 2017-08-26: 1 mg via INTRAMUSCULAR
  Filled 2017-08-26: qty 1

## 2017-08-26 MED ORDER — OXYTOCIN 40 UNITS IN LACTATED RINGERS INFUSION - SIMPLE MED
2.5000 [IU]/h | INTRAVENOUS | Status: DC
  Filled 2017-08-26: qty 1000

## 2017-08-26 MED ORDER — LACTATED RINGERS IV SOLN
INTRAVENOUS | Status: DC
  Administered 2017-08-26: 23:00:00 via INTRAVENOUS

## 2017-08-26 MED ORDER — EPHEDRINE 5 MG/ML INJ
10.0000 mg | INTRAVENOUS | Status: DC | PRN
  Filled 2017-08-26: qty 2

## 2017-08-26 MED ORDER — LIDOCAINE HCL (PF) 1 % IJ SOLN
INTRAMUSCULAR | Status: DC | PRN
  Administered 2017-08-26 (×2): 4 mL

## 2017-08-26 MED ORDER — LACTATED RINGERS IV SOLN: 500.0000 mL | INTRAVENOUS | Status: DC | PRN

## 2017-08-26 MED ORDER — LACTATED RINGERS IV SOLN
INTRAVENOUS | Status: DC
  Administered 2017-08-26: 11:00:00 via INTRAVENOUS

## 2017-08-26 MED ORDER — FLEET ENEMA 7-19 GM/118ML RE ENEM: 1.0000 | ENEMA | RECTAL | Status: DC | PRN

## 2017-08-26 MED ORDER — LACTATED RINGERS IV SOLN: 500.0000 mL | Freq: Once | INTRAVENOUS | Status: DC

## 2017-08-26 MED ORDER — LIDOCAINE HCL (PF) 1 % IJ SOLN
30.0000 mL | INTRAMUSCULAR | Status: AC | PRN
  Administered 2017-08-27 (×2): 30 mL via SUBCUTANEOUS
  Filled 2017-08-26: qty 30

## 2017-08-26 MED ORDER — DIPHENHYDRAMINE HCL 50 MG/ML IJ SOLN: 12.5000 mg | INTRAMUSCULAR | Status: DC | PRN

## 2017-08-26 MED ORDER — ACETAMINOPHEN 325 MG PO TABS
650.0000 mg | ORAL_TABLET | ORAL | Status: DC | PRN
  Administered 2017-08-26 – 2017-08-27 (×2): 650 mg via ORAL
  Filled 2017-08-26 (×2): qty 2

## 2017-08-26 MED ORDER — OXYTOCIN BOLUS FROM INFUSION
500.0000 mL | Freq: Once | INTRAVENOUS | Status: AC
  Administered 2017-08-27: 500 mL via INTRAVENOUS

## 2017-08-26 MED ORDER — PHENYLEPHRINE 40 MCG/ML (10ML) SYRINGE FOR IV PUSH (FOR BLOOD PRESSURE SUPPORT)
80.0000 ug | PREFILLED_SYRINGE | INTRAVENOUS | Status: DC | PRN
  Filled 2017-08-26: qty 5
  Filled 2017-08-26: qty 10

## 2017-08-26 MED ORDER — PHENYLEPHRINE 40 MCG/ML (10ML) SYRINGE FOR IV PUSH (FOR BLOOD PRESSURE SUPPORT)
80.0000 ug | PREFILLED_SYRINGE | INTRAVENOUS | Status: DC | PRN
  Filled 2017-08-26: qty 5

## 2017-08-26 MED ORDER — ONDANSETRON HCL 4 MG/2ML IJ SOLN
4.0000 mg | Freq: Four times a day (QID) | INTRAMUSCULAR | Status: DC | PRN
  Administered 2017-08-26 – 2017-08-27 (×2): 4 mg via INTRAVENOUS
  Filled 2017-08-26 (×3): qty 2

## 2017-08-26 MED ORDER — OXYCODONE-ACETAMINOPHEN 5-325 MG PO TABS: 1.0000 | ORAL_TABLET | ORAL | Status: DC | PRN

## 2017-08-26 NOTE — Anesthesia Procedure Notes (Signed)
Epidural Patient location during procedure: OB  Staffing Anesthesiologist: Levie Owensby, MD Performed: anesthesiologist   Preanesthetic Checklist Completed: patient identified, pre-op evaluation, timeout performed, IV checked, risks and benefits discussed and monitors and equipment checked  Epidural Patient position: sitting Prep: site prepped and draped and DuraPrep Patient monitoring: heart rate, continuous pulse ox and blood pressure Approach: midline Location: L3-L4 Injection technique: LOR air and LOR saline  Needle:  Needle type: Tuohy  Needle gauge: 17 G Needle length: 9 cm Needle insertion depth: 7 cm Catheter type: closed end flexible Catheter size: 19 Gauge Catheter at skin depth: 12 cm Test dose: negative  Assessment Sensory level: T8 Events: blood not aspirated, injection not painful, no injection resistance, negative IV test and no paresthesia  Additional Notes Reason for block:procedure for pain     

## 2017-08-26 NOTE — MAU Note (Signed)
Dr. Juliene PinaMody viewed strip & variable decel, requests EFM continue until 1145.  If no further decels, would like for pt to ambulate.

## 2017-08-26 NOTE — MAU Provider Note (Signed)
FHT reactive Toco rare SVE- unchanged at 4 cm per RN GBS(+) Threatened preterm labor with no progress  Discharge home. Hydrate well. F/up Dr Billy Coasttaavon per schedule.

## 2017-08-26 NOTE — Progress Notes (Signed)
Dr. Juliene PinaMody reviewed pt's tracing again, states pt may walk now.

## 2017-08-26 NOTE — MAU Note (Signed)
Pt sent from MD office, pt states she has had uc's for the "last few days."  Moved more into her back during the night, started vomiting this morning, denies diarrhea.  Also having HA's & dizziness for the last week.  SVE @ MD office 3-4 cm's this morning.

## 2017-08-26 NOTE — MAU Note (Signed)
Pt reports contractions are worsening.

## 2017-08-26 NOTE — MAU Provider Note (Addendum)
History   CSN: [id number] Arrival date and time: 08/26/17 [id number]  Chief Complaint  Patient presents with  . Contractions  . Emesis   HPI  31 yo, G2P1001, at 36.1 wks, sent from office by Dr Amado NashAlmquist for contractions, emesis, not feeling well, protein on urine dip.  Hyperemesis the entire pregnancy, using Zofran, Diclegis but stopped at 30 wks as she was better. Now c/o nausea and vomiting since last night, contactions getting stronger. Some blood show and back/ rectal pressure with contractions.  Threatened preterm labor this pregnancy and s/p BMTZ course.   Prior term SVD.    Past Medical History:  Diagnosis Date  . Anxiety   . Depression   . Fatigue   . Generalized headaches   . History of shingles   . Hypothyroidism   . Migraines   . Nausea and vomiting     Past Surgical History:  Procedure Laterality Date  . AUGMENTATION MAMMAPLASTY  2007  . WISDOM TOOTH EXTRACTION  2007    Family History  Problem Relation Age of Onset  . Hypertension Father   . Cancer Maternal Grandmother        ovarian or uterine cancer and throat cancer  . Breast cancer Maternal Grandmother [id number]  . Lung cancer Paternal Grandfather        lung  . Lymphoma Paternal Grandfather   . Cancer Paternal Grandfather        lung  . Cancer Maternal Grandfather        lung  . Alzheimer's disease Paternal Grandmother   . Bipolar disorder Paternal Uncle   . Colon cancer Neg Hx     Social History   Tobacco Use  . Smoking status: Never Smoker  . Smokeless tobacco: Never Used  . Tobacco comment: Patient non-smoker  Substance Use Topics  . Alcohol use: Yes    Alcohol/week: 0.0 oz    Types: 1 - 2 Glasses of wine per week    Comment: occas  . Drug use: No    Allergies: No Known Allergies  Medications Prior to Admission  Medication Sig Dispense Refill Last Dose  . DICLEGIS 10-10 MG TBEC Take 1-2 tablets by mouth as directed. Take 1 tablet iin the morning, take 1 tablet during the afternoon and  take 2 tablets at bedtime.  0 02/28/2017 at Unknown time  . levothyroxine (SYNTHROID, LEVOTHROID) 50 MCG tablet Take 50 mcg by mouth daily.  6 02/28/2017 at Unknown time  . metoCLOPramide (REGLAN) 10 MG tablet Take 10 mg by mouth every 8 (eight) hours.  0 02/28/2017 at Unknown time  . ondansetron (ZOFRAN ODT) 4 MG disintegrating tablet Take 1 tablet (4 mg total) by mouth every 6 (six) hours as needed for nausea. 20 tablet 2 02/28/2017 at Unknown time  . ondansetron (ZOFRAN) 4 MG tablet Take 4 mg by mouth 3 (three) times daily as needed for nausea or vomiting.   0 02/16/2017 at Unknown time  . terconazole (TERAZOL 7) 0.4 % vaginal cream Place 1 applicator vaginally at bedtime. 45 g 0     Review of Systems Physical Exam   Physical Exam BP 130/79 (BP Location: Left Arm)   Pulse 88   Temp 97.9 F (36.6 C) (Oral)   Resp 20   LMP 12/16/2016   A&O x 3, no acute distress. Pleasant HEENT neg, no thyromegaly Lungs CTA bilat CV RRR, S1S2 normal Abdo soft, non tender, non acute Extr no edema/ tenderness Pelvic per RN 4/ 70%/-2/ Vx/ intact/ blood show  FHT 140s/ + accels/ no decels/ mod variability. One variable decel but good reactivity.  Toco per patient every 5 min  MAU Course  Procedures IV hydration, Zofran 4 mg IV Ambulate once feeling better Recheck cx for labor PIH labs per Dr Amado NashAlmquist   Assessment and Plan  31 yo G2P1001, 36.1 wks, threatened preterm labor, dehydration from emesis, ketonuria. FHT cat I Office GBS checked- POSITIVE, needs PCN in labor.   Robley FriesVaishali R Brienne Liguori 08/26/2017, 11:00 AM

## 2017-08-26 NOTE — Anesthesia Preprocedure Evaluation (Signed)
Anesthesia Evaluation  Patient identified by MRN, date of birth, ID band Patient awake    Reviewed: Allergy & Precautions, H&P , Patient's Chart, lab work & pertinent test results  Airway Mallampati: III  TM Distance: >3 FB Neck ROM: full    Dental   Pulmonary    breath sounds clear to auscultation       Cardiovascular  Rhythm:regular Rate:Normal     Neuro/Psych  Headaches,  Neuromuscular disease    GI/Hepatic GERD  ,  Endo/Other    Renal/GU      Musculoskeletal   Abdominal   Peds  Hematology   Anesthesia Other Findings   Reproductive/Obstetrics (+) Pregnancy                             Anesthesia Physical  Anesthesia Plan  ASA: II  Anesthesia Plan: Epidural   Post-op Pain Management:    Induction:   PONV Risk Score and Plan:   Airway Management Planned:   Additional Equipment:   Intra-op Plan:   Post-operative Plan:   Informed Consent: I have reviewed the patients History and Physical, chart, labs and discussed the procedure including the risks, benefits and alternatives for the proposed anesthesia with the patient or authorized representative who has indicated his/her understanding and acceptance.     Plan Discussed with:   Anesthesia Plan Comments:         Anesthesia Quick Evaluation

## 2017-08-26 NOTE — Progress Notes (Signed)
Dr. Juliene PinaMody called to check on pt., putting in orders for IV fluids & labs, is coming to see pt.

## 2017-08-26 NOTE — MAU Note (Signed)
Dr. Juliene PinaMody in to see pt.  Pt became very nauseated & vomited after IV start, very shaky.  Husband @ bedside.

## 2017-08-26 NOTE — Discharge Instructions (Signed)
Braxton Hicks Contractions °Contractions of the uterus can occur throughout pregnancy, but they are not always a sign that you are in labor. You may have practice contractions called Braxton Hicks contractions. These false labor contractions are sometimes confused with true labor. °What are Braxton Hicks contractions? °Braxton Hicks contractions are tightening movements that occur in the muscles of the uterus before labor. Unlike true labor contractions, these contractions do not result in opening (dilation) and thinning of the cervix. Toward the end of pregnancy (32-34 weeks), Braxton Hicks contractions can happen more often and may become stronger. These contractions are sometimes difficult to tell apart from true labor because they can be very uncomfortable. You should not feel embarrassed if you go to the hospital with false labor. °Sometimes, the only way to tell if you are in true labor is for your health care provider to look for changes in the cervix. The health care provider will do a physical exam and may monitor your contractions. If you are not in true labor, the exam should show that your cervix is not dilating and your water has not broken. °If there are no prenatal problems or other health problems associated with your pregnancy, it is completely safe for you to be sent home with false labor. You may continue to have Braxton Hicks contractions until you go into true labor. °How can I tell the difference between true labor and false labor? °· Differences °? False labor °? Contractions last 30-70 seconds.: Contractions are usually shorter and not as strong as true labor contractions. °? Contractions become very regular.: Contractions are usually irregular. °? Discomfort is usually felt in the top of the uterus, and it spreads to the lower abdomen and low back.: Contractions are often felt in the front of the lower abdomen and in the groin. °? Contractions do not go away with walking.: Contractions may  go away when you walk around or change positions while lying down. °? Contractions usually become more intense and increase in frequency.: Contractions get weaker and are shorter-lasting as time goes on. °? The cervix dilates and gets thinner.: The cervix usually does not dilate or become thin. °Follow these instructions at home: °· Take over-the-counter and prescription medicines only as told by your health care provider. °· Keep up with your usual exercises and follow other instructions from your health care provider. °· Eat and drink lightly if you think you are going into labor. °· If Braxton Hicks contractions are making you uncomfortable: °? Change your position from lying down or resting to walking, or change from walking to resting. °? Sit and rest in a tub of warm water. °? Drink enough fluid to keep your urine clear or pale yellow. Dehydration may cause these contractions. °? Do slow and deep breathing several times an hour. °· Keep all follow-up prenatal visits as told by your health care provider. This is important. °Contact a health care provider if: °· You have a fever. °· You have continuous pain in your abdomen. °Get help right away if: °· Your contractions become stronger, more regular, and closer together. °· You have fluid leaking or gushing from your vagina. °· You have bleeding from your vagina. °· You have low back pain that you never had before. °· You feel your baby’s head pushing down and causing pelvic pressure. °· Your baby is not moving inside you as much as it used to. °Summary °· Contractions that occur before labor are called Braxton Hicks contractions, false labor, or   practice contractions.  Braxton Hicks contractions are usually shorter, weaker, farther apart, and less regular than true labor contractions. True labor contractions usually become progressively stronger and regular and they become more frequent.  [facility] by changing  position, resting in a warm bath, drinking plenty of water, or practicing deep breathing. This information is not intended to replace advice given to you by your health care provider. Make sure you discuss any questions you have with your health care provider. Document Released: 09/15/2005 Document Revised: 08/04/2016 Document Reviewed: 08/04/2016 Elsevier Interactive Patient Education  2017 [employer]   Dr. Amado NashAlmquist is sending a prescription for valtrex to your pharmacy.  Start taking once a day today.

## 2017-08-26 NOTE — H&P (Signed)
Ariana Spencer is a 31 y.o. female G2P1001 at 36.1 presenting for regular and painful contractions.  Denies any vaginal bleeding, lof; +FM.  The patient is s/p evaluation in MAU today and d/c at 4cm, without further cervical change.  Antepartum course complicated by  Hypothyroid and has been on synthroid.  She also had PTL at 30wga and received BMZ x2 (11/7, 11/8).  She also has a h/o HSV and denies any current outbreak - she has not started prophylaxis yet. P[facility] at [facility] since 8 wks History OB History    Gravida Para Term Preterm AB Living   2 1 1     1    SAB TAB Ectopic Multiple Live Births           1     Past Medical History:  Diagnosis Date  . Anxiety   . Depression   . Fatigue   . Generalized headaches   . History of shingles   . Hypothyroidism   . Migraines   . Nausea and vomiting    Past Surgical History:  Procedure Laterality Date  . AUGMENTATION MAMMAPLASTY  2007  . WISDOM TOOTH EXTRACTION  2007   Family History: family history includes Alzheimer's disease in her paternal grandmother; Bipolar disorder in her paternal uncle; Breast cancer (age of onset: 2670) in her maternal grandmother; Cancer in her maternal grandfather, maternal grandmother, and paternal grandfather; Hypertension in her father; Lung cancer in her paternal grandfather; Lymphoma in her paternal grandfather. Social History:  reports that  has never smoked. she has never used smokeless tobacco. She reports that she drinks alcohol. She reports that she does not use drugs.  ROS  Dilation: 5.5 Effacement (%): 50 Station: -2 Exam by:: lauren fields, rn  Blood pressure 116/70, pulse (!) 115, temperature 97.9 F (36.6 C), temperature source Oral, resp. rate 20, last menstrual period 12/16/2016, SpO2 100 %, unknown if currently breastfeeding. Exam Physical Exam  Prenatal labs: ABO, Rh: --/--/A POS (11/28 2116) Antibody: NEG (11/28 2116) Rubella: Immune (05/15 0000) RPR: Nonreactive (05/15 0000)   HBsAg: Negative (05/15 0000)  HIV: Non-reactive (05/15 0000)  GBS: Positive (11/20 0000)  1 hr Glucola - passed Genetic screening - neg Anatomy US - normal  Physical Exam:  A&O x 3 HEENT - grossly wnl Lungs : ctab CV : rrr Abdo : soft, nt, efw: 6 1/2 lbas Extr : no edema, nt bilat Pelvic 7/90/-2, cephalic  FHT: 120s, normal variability, +accelerations, no decelerations TOCO: q 1-2 min   Assessment/Plan: iup at 36.1 wga 1. Admit for active labor - plan svd 2. Preterm - s/p bmz 3. gbs pos: contin pcn prophylaxis 4. Rh pos 5. Rubella immune 6. Hypothyroid - synthroid, contin pp 7. hsv - no active lesions; did not start prophylaxis    Vick FreesSusan E Sabre Romberger 08/26/2017, 10:54 PM

## 2017-08-27 ENCOUNTER — Encounter (HOSPITAL_COMMUNITY): Payer: Self-pay | Admitting: Obstetrics and Gynecology

## 2017-08-27 LAB — RPR: RPR: NONREACTIVE

## 2017-08-27 MED ORDER — IBUPROFEN 600 MG PO TABS
600.0000 mg | ORAL_TABLET | Freq: Four times a day (QID) | ORAL | Status: DC
  Administered 2017-08-27 – 2017-08-28 (×6): 600 mg via ORAL
  Filled 2017-08-27 (×6): qty 1

## 2017-08-27 MED ORDER — SIMETHICONE 80 MG PO CHEW
80.0000 mg | CHEWABLE_TABLET | ORAL | Status: DC | PRN
  Administered 2017-08-27: 80 mg via ORAL
  Filled 2017-08-27: qty 1

## 2017-08-27 MED ORDER — DIPHENHYDRAMINE HCL 25 MG PO CAPS: 25.0000 mg | ORAL_CAPSULE | Freq: Four times a day (QID) | ORAL | Status: DC | PRN

## 2017-08-27 MED ORDER — WITCH HAZEL-GLYCERIN EX PADS: 1.0000 "application " | MEDICATED_PAD | CUTANEOUS | Status: DC | PRN

## 2017-08-27 MED ORDER — SENNOSIDES-DOCUSATE SODIUM 8.6-50 MG PO TABS
2.0000 | ORAL_TABLET | ORAL | Status: DC
  Administered 2017-08-27: 2 via ORAL
  Filled 2017-08-27: qty 2

## 2017-08-27 MED ORDER — DIBUCAINE 1 % RE OINT: 1.0000 "application " | TOPICAL_OINTMENT | RECTAL | Status: DC | PRN

## 2017-08-27 MED ORDER — PRENATAL MULTIVITAMIN CH
1.0000 | ORAL_TABLET | Freq: Every day | ORAL | Status: DC
  Administered 2017-08-27 – 2017-08-28 (×2): 1 via ORAL
  Filled 2017-08-27 (×2): qty 1

## 2017-08-27 MED ORDER — ZOLPIDEM TARTRATE 5 MG PO TABS: 5.0000 mg | ORAL_TABLET | Freq: Every evening | ORAL | Status: DC | PRN

## 2017-08-27 MED ORDER — ACETAMINOPHEN 325 MG PO TABS
650.0000 mg | ORAL_TABLET | ORAL | Status: DC | PRN
  Administered 2017-08-27 – 2017-08-28 (×4): 650 mg via ORAL
  Filled 2017-08-27 (×4): qty 2

## 2017-08-27 MED ORDER — ONDANSETRON HCL 4 MG PO TABS: 4.0000 mg | ORAL_TABLET | Freq: Four times a day (QID) | ORAL | Status: DC | PRN

## 2017-08-27 MED ORDER — BENZOCAINE-MENTHOL 20-0.5 % EX AERO
1.0000 "application " | INHALATION_SPRAY | CUTANEOUS | Status: DC | PRN
  Administered 2017-08-27: 1 via TOPICAL
  Filled 2017-08-27: qty 56

## 2017-08-27 MED ORDER — CALCIUM CARBONATE ANTACID 500 MG PO CHEW
1.0000 | CHEWABLE_TABLET | Freq: Once | ORAL | Status: AC
  Administered 2017-08-27: 200 mg via ORAL
  Filled 2017-08-27: qty 1

## 2017-08-27 MED ORDER — ONDANSETRON HCL 4 MG/2ML IJ SOLN: 4.0000 mg | Freq: Four times a day (QID) | INTRAMUSCULAR | Status: DC | PRN

## 2017-08-27 MED ORDER — COCONUT OIL OIL
1.0000 "application " | TOPICAL_OIL | Status: DC | PRN
  Filled 2017-08-27: qty 120

## 2017-08-27 NOTE — Progress Notes (Signed)
Comfortable with epidural, nauseous  Patient Vitals for the past 24 hrs:  BP Temp Temp src Pulse Resp SpO2  08/27/17 0231 (!) 102/47 - - 71 18 -  08/27/17 0201 (!) 109/51 - - 79 16 -  08/27/17 0131 113/80 97.6 F (36.4 C) Oral 89 16 -  08/27/17 0102 (!) 111/47 - - 81 - -  08/27/17 0032 (!) 103/53 - - 76 18 -  08/27/17 0005 - - - - - 100 %  08/27/17 0002 126/74 - - (!) 103 18 -  08/27/17 0000 - - - - - 100 %  08/26/17 2355 - - - - - 100 %  08/26/17 2350 - - - - - 100 %  08/26/17 2345 - - - - - 100 %  08/26/17 2331 115/67 - - 93 18 -  08/26/17 2330 - - - - - 99 %  08/26/17 2325 - - - - - 99 %  08/26/17 2320 - - - - - 100 %  08/26/17 2315 - - - - - 100 %  08/26/17 2310 - - - - - 97 %  08/26/17 2305 - - - - - 99 %  08/26/17 2301 115/72 - - (!) 110 20 -  08/26/17 2300 - - - - - 98 %  08/26/17 2255 - - - - - 96 %  08/26/17 2250 - - - - - 96 %  08/26/17 2246 116/70 - - (!) 115 20 100 %  08/26/17 2245 - - - - - 100 %  08/26/17 2241 120/81 - - (!) 114 20 100 %  08/26/17 2240 - - - - - 100 %  08/26/17 2236 (!) 139/38 - - (!) 106 - 100 %  08/26/17 2235 - - - - - 100 %  08/26/17 2231 (!) 161/81 - - (!) 126 - 100 %  08/26/17 2225 132/76 - - 96 18 99 %  08/26/17 2220 - - - - - 99 %  08/26/17 2218 111/81 - - (!) 101 18 99 %  08/26/17 2217 - - - - - 99 %  08/26/17 2215 - - - - - 99 %  08/26/17 2211 (!) 147/73 - - 83 20 98 %  08/26/17 2206 - - - - - 99 %  08/26/17 2204 136/80 - - (!) 104 18 99 %  08/26/17 2137 133/73 97.9 F (36.6 C) Oral (!) 108 20 -  08/26/17 1828 131/83 98.2 F (36.8 C) Oral 91 19 100 %   A&ox3 Abd: soft, nt, gravid Cx: c/c/0, arom with clear fluid LE: no edema, nt LE  FHT: 120s, normal variability; +accels, no decels TOCO: q 2, difficult to pick up  A/p: iup at 36.2 1. Active labor, plan svd 2. Fetal status reassuring 3. gbs positive, s/p 2 doses pcn 4. Rh pos 5. Rubella immune 6. hsv - no active lesions 7. hypothyroid

## 2017-08-27 NOTE — Anesthesia Postprocedure Evaluation (Signed)
Anesthesia Post Note  Patient: Ariana Spencer  Procedure(s) Performed: AN AD HOC LABOR EPIDURAL     Patient location during evaluation: Mother Baby Anesthesia Type: Epidural Level of consciousness: awake and alert and oriented Pain management: pain level controlled Vital Signs Assessment: post-procedure vital signs reviewed and stable Respiratory status: spontaneous breathing and nonlabored ventilation Cardiovascular status: stable Postop Assessment: no headache, no apparent nausea or vomiting, no backache, adequate PO intake, epidural receding and patient able to bend at knees Anesthetic complications: no    Last Vitals:  Vitals:   08/27/17 0645 08/27/17 0720  BP: 130/69 128/66  Pulse: (!) 104 91  Resp:  18  Temp: 37 C 37.1 C  SpO2:      Last Pain:  Vitals:   08/27/17 1034  TempSrc:   PainSc: 2    Pain Goal: Patients Stated Pain Goal: 0 (08/26/17 1945)               Laban EmperorMalinova,Ariana Spencer

## 2017-08-27 NOTE — Lactation Note (Signed)
This note was copied from a baby's chart. Lactation Consultation Note  Patient Name: Ariana Spencer Reason for consult: Initial assessment;Late-preterm 34-36.6wks;Difficult latch   Initial assessment with mom of 13 hour old LPT infant. Infant STS with mom and sleeping at this time. She reports he recently took 10 cc colostrum via bottle.   Mom reports she had difficulty latching first child and would not like to stress about this child latching. She is latching some with a NS and plans to continue to pump and bottle feed as needed.   Enc mom to pump every 2-3 hours for 15 minutes. LPT infant feeding handout given and explained. Reviewed supplementation amounts with mom. Mom has been able to pump about 15 cc with last pumping.   Mom reports she has no questions/concerns at this time. Enc mom to call out for latch assistance as needed.   BF Resources Handout and LC Brochure given, mom informed of IP/OP Services, BF Support Groups and LC phone #.   Maternal Data Formula Feeding for Exclusion: No Has patient been taught Hand Expression?: Yes Does the patient have breastfeeding experience prior to this delivery?: Yes  Feeding Feeding Type: Bottle Fed - Breast Milk  LATCH Score                   Interventions Interventions: Hand express;DEBP  Lactation Tools Discussed/Used WIC Program: No Pump Review: Setup, frequency, and cleaning;Milk Storage Initiated by:: Reviewed and encouraged every 2-3 hours   Consult Status Consult Status: Follow-up Date: 08/28/17 Follow-up type: In-patient    Silas FloodSharon S Evie Croston Spencer, 5:55 PM

## 2017-08-28 LAB — CBC
HCT: 31.1 % — ABNORMAL LOW (ref 36.0–46.0)
Hemoglobin: 10.1 g/dL — ABNORMAL LOW (ref 12.0–15.0)
MCH: 28.3 pg (ref 26.0–34.0)
MCHC: 32.5 g/dL (ref 30.0–36.0)
MCV: 87.1 fL (ref 78.0–100.0)
PLATELETS: 172 10*3/uL (ref 150–400)
RBC: 3.57 MIL/uL — ABNORMAL LOW (ref 3.87–5.11)
RDW: 13.3 % (ref 11.5–15.5)
WBC: 6.6 10*3/uL (ref 4.0–10.5)

## 2017-08-28 NOTE — Discharge Summary (Signed)
OB Discharge Summary  Patient Name: Ariana Spencer DOB: August 24, 1986 MRN: [id number]  Date of admission: 08/26/2017 Delivering MD: Carlean JewsSIGMON, MEREDITH C   Date of discharge: 08/28/2017  Admitting diagnosis: 36.1WKS CTX Intrauterine pregnancy: 813w2d     Secondary diagnosis:Principal Problem:   Postpartum care following vaginal delivery (11/29) Active Problems:   Indication for care in labor or delivery   SVD (spontaneous vaginal delivery)   Second-degree perineal laceration, with delivery   Preterm delivery, delivered  Additional problems:none     Discharge diagnosis: Term Pregnancy Delivered                                                                     Post partum procedures:none  Augmentation: none  Complications: None  Hospital course:  Onset of Labor With Vaginal Delivery     31 y.o. yo [id number] at 4013w2d was admitted in Active Labor on 08/26/2017. Patient had an uncomplicated labor course as follows:  Membrane Rupture Time/Date: 2:53 AM ,08/27/2017   Intrapartum Procedures: Episiotomy: None [1]                                         Lacerations:  2nd degree [3];Labial [10]  Patient had a delivery of a Viable infant. 08/27/2017  Information for the patient's newborn:  Dorice LamasMichael, Boy Kaiyana [[id number]][[id number]]  Delivery Method: Vaginal, Spontaneous(Filed from Delivery Summary)    Pateint had an uncomplicated postpartum course.  She is ambulating, tolerating a regular diet, passing flatus, and urinating well. Patient is discharged home in stable condition on 08/28/17.   Physical exam  Vitals:   08/27/17 0720 08/27/17 1129 08/27/17 1907 08/28/17 0629  BP: 128/66 124/78 124/70 122/70  Pulse: 91 80 75 81  Resp: 18 18 18 18   Temp: 98.7 F (37.1 C) 97.6 F (36.4 C) 98.1 F (36.7 C) 97.8 F (36.6 C)  TempSrc: Oral Oral Oral Oral  SpO2:  99%     General: alert Lochia: appropriate Uterine Fundus: firm Incision: N/A DVT Evaluation: No evidence of DVT seen on  physical exam. Labs: Lab Results  Component Value Date   WBC 6.6 08/28/2017   HGB 10.1 (L) 08/28/2017   HCT 31.1 (L) 08/28/2017   MCV 87.1 08/28/2017   PLT 172 08/28/2017   CMP Latest Ref Rng & Units 08/26/2017  Glucose 65 - 99 mg/dL 78  BUN 6 - 20 mg/dL 10  Creatinine 1.300.44 - 8.651.00 mg/dL 7.840.59  Sodium 696135 - 295145 mmol/L 133(L)  Potassium 3.5 - 5.1 mmol/L 4.2  Chloride 101 - 111 mmol/L 106  CO2 22 - 32 mmol/L 17(L)  Calcium 8.9 - 10.3 mg/dL 8.9  Total Protein 6.5 - 8.1 g/dL 6.3(L)  Total Bilirubin 0.3 - 1.2 mg/dL 0.6  Alkaline Phos 38 - 126 U/L 157(H)  AST 15 - 41 U/L 17  ALT 14 - 54 U/L 12(L)    Discharge instruction: per After Visit Summary and "Baby and Me Booklet".  After Visit Meds:  Tylenol, Motrin as needed OK to cont Zantac if needed  Diet: routine diet  Activity: Advance as tolerated. Pelvic rest for 6 weeks.   Outpatient follow  up:6 weeks Follow up Appt:No future appointments. Follow up visit: No Follow-up on file.  Postpartum contraception: IUD Mirena  Newborn Data: Live born female  Birth Weight: 6 lb 14.6 oz (3135 g) APGAR: 9, 9  Newborn Delivery   Birth date/time:  08/27/2017 04:16:00 Delivery type:  Vaginal, Spontaneous     Baby Feeding: Breast Disposition:home with mother   08/28/2017 Lendon ColonelKelly A Oluwatobi Ruppe, MD

## 2017-08-28 NOTE — Progress Notes (Signed)
MOB was referred for history of depression/anxiety. * Referral screened out by Clinical Social Worker because none of the following criteria appear to apply: ~ History of anxiety/depression during this pregnancy, or of post-partum depression. ~ Diagnosis of anxiety and/or depression within last 3 years;  No concerns noted in OB records. OR * MOB's symptoms currently being treated with medication and/or therapy.  Please contact the Clinical Social Worker if needs arise, by [facility], or if MOB scores greater than 9/yes to question 10 on [place] Postpartum Depression Screen.  Blaine HamperAngel Boyd-Gilyard, MSW, LCSW Clinical Social Work [phone]

## 2017-08-28 NOTE — Lactation Note (Signed)
This note was copied from a baby's chart. Lactation Consultation Note: Mother reports that she is pumping every 2-3 hours. Mother reports that her nipples are sore. Observed that her nipples are pink bilaterally. Mother was advised to try using #27 flanges when she pumps next. Mother reports that she has a Freemie pump at home and plans to use when she is discharged.  Mother was given comfort gels and advised to use exp breastmilk to apply to nipples, mother also has coconut oil and advised to use what gives her most relief. Mother to page as needed.  Patient Name: Boy Jerel Shepherdmanda Jackowski [id number]'s Date: 08/28/2017 Reason for consult: Follow-up assessment   Maternal Data    Feeding Feeding Type: Other (comment)(encouraged mom to wake and feed)  LATCH Score                   Interventions    Lactation Tools Discussed/Used     Consult Status Consult Status: Follow-up Date: 08/29/17 Follow-up type: In-patient    Stevan BornKendrick, Stanley Lyness [facility] 08/28/2017, 2:03 PM

## 2017-08-29 ENCOUNTER — Ambulatory Visit: Payer: Self-pay

## 2017-08-29 NOTE — Lactation Note (Signed)
This note was copied from a baby's chart. Lactation Consultation Note  Patient Name: Ariana Spencer  Mom's milk is coming to volume.  Breasts are filling/soft with a few small firm areas.  Mom worried she was not able to pump any milk from left breast last pumping.  Recommended heat and massage prior to and during pumping.  Also discussed relaxing and not watching milk.  Reassured.   Maternal Data    Feeding    LATCH Score                   Interventions    Lactation Tools Discussed/Used     Consult Status      Huston FoleyMOULDEN, [name], 8:53 AM

## 2017-08-31 ENCOUNTER — Encounter (HOSPITAL_COMMUNITY): Payer: Self-pay | Admitting: *Deleted

## 2017-08-31 ENCOUNTER — Inpatient Hospital Stay (HOSPITAL_COMMUNITY): Payer: 59

## 2017-08-31 ENCOUNTER — Inpatient Hospital Stay (HOSPITAL_COMMUNITY)
Admission: AD | Admit: 2017-08-31 | Discharge: 2017-09-01 | Disposition: A | Discharge status: Home / Self Care | Payer: 59 | Source: Ambulatory Visit | Attending: Obstetrics and Gynecology | Admitting: Obstetrics and Gynecology

## 2017-08-31 DIAGNOSIS — R51 Headache: Secondary | ICD-10-CM | POA: Diagnosis not present

## 2017-08-31 DIAGNOSIS — Z9889 Other specified postprocedural states: Secondary | ICD-10-CM | POA: Diagnosis not present

## 2017-08-31 DIAGNOSIS — F329 Major depressive disorder, single episode, unspecified: Secondary | ICD-10-CM | POA: Diagnosis not present

## 2017-08-31 DIAGNOSIS — Z79899 Other long term (current) drug therapy: Secondary | ICD-10-CM | POA: Diagnosis not present

## 2017-08-31 DIAGNOSIS — R52 Pain, unspecified: Secondary | ICD-10-CM

## 2017-08-31 DIAGNOSIS — R1031 Right lower quadrant pain: Secondary | ICD-10-CM | POA: Insufficient documentation

## 2017-08-31 DIAGNOSIS — E039 Hypothyroidism, unspecified: Secondary | ICD-10-CM | POA: Diagnosis not present

## 2017-08-31 DIAGNOSIS — Z8619 Personal history of other infectious and parasitic diseases: Secondary | ICD-10-CM | POA: Insufficient documentation

## 2017-08-31 DIAGNOSIS — O9089 Other complications of the puerperium, not elsewhere classified: Secondary | ICD-10-CM

## 2017-08-31 DIAGNOSIS — F419 Anxiety disorder, unspecified: Secondary | ICD-10-CM | POA: Insufficient documentation

## 2017-08-31 DIAGNOSIS — O9989 Other specified diseases and conditions complicating pregnancy, childbirth and the puerperium: Secondary | ICD-10-CM | POA: Insufficient documentation

## 2017-08-31 DIAGNOSIS — R112 Nausea with vomiting, unspecified: Secondary | ICD-10-CM | POA: Diagnosis not present

## 2017-08-31 LAB — COMPREHENSIVE METABOLIC PANEL
ALK PHOS: 106 U/L (ref 38–126)
ALT: 46 U/L (ref 14–54)
AST: 33 U/L (ref 15–41)
Albumin: 2.9 g/dL — ABNORMAL LOW (ref 3.5–5.0)
Anion gap: 8 (ref 5–15)
BILIRUBIN TOTAL: 0.2 mg/dL — AB (ref 0.3–1.2)
BUN: 20 mg/dL (ref 6–20)
CALCIUM: 8.2 mg/dL — AB (ref 8.9–10.3)
CO2: 23 mmol/L (ref 22–32)
CREATININE: 0.88 mg/dL (ref 0.44–1.00)
Chloride: 104 mmol/L (ref 101–111)
GFR calc non Af Amer: >60 mL/min (ref >60)
GLUCOSE: 100 mg/dL — AB (ref 65–99)
Potassium: 4.3 mmol/L (ref 3.5–5.1)
SODIUM: 135 mmol/L (ref 135–145)
TOTAL PROTEIN: 6.4 g/dL — AB (ref 6.5–8.1)

## 2017-08-31 LAB — CBC
HCT: 33.4 % — ABNORMAL LOW (ref 36.0–46.0)
Hemoglobin: 10.8 g/dL — ABNORMAL LOW (ref 12.0–15.0)
MCH: 28.1 pg (ref 26.0–34.0)
MCHC: 32.3 g/dL (ref 30.0–36.0)
MCV: 87 fL (ref 78.0–100.0)
Platelets: 252 10*3/uL (ref 150–400)
RBC: 3.84 MIL/uL — ABNORMAL LOW (ref 3.87–5.11)
RDW: 13.5 % (ref 11.5–15.5)
WBC: 9.9 10*3/uL (ref 4.0–10.5)

## 2017-08-31 LAB — LIPASE, BLOOD: Lipase: 27 U/L (ref 11–51)

## 2017-08-31 LAB — URINALYSIS, ROUTINE W REFLEX MICROSCOPIC
BILIRUBIN URINE: NEGATIVE
Glucose, UA: NEGATIVE mg/dL
Ketones, ur: NEGATIVE mg/dL
NITRITE: NEGATIVE
PROTEIN: NEGATIVE mg/dL
Specific Gravity, Urine: 1.014 (ref 1.005–1.030)
pH: 6 (ref 5.0–8.0)

## 2017-08-31 MED ORDER — OXYCODONE-ACETAMINOPHEN 5-325 MG PO TABS
2.0000 | ORAL_TABLET | ORAL | Status: AC
  Administered 2017-08-31: 2 via ORAL
  Filled 2017-08-31: qty 2

## 2017-08-31 NOTE — MAU Note (Signed)
Pt reports extreme pain in R lower abdomen. The pain came on suddenly this evening. Pt took tylenol @ 2045 with no relief. Pt also has dizziness, swelling and headache. Pt has had headaches for 3-4 weeks, but the pain is worse today. Pt also reports an increase in bleeding today with some clots ranging in size from a quarter to a golf ball.

## 2017-08-31 NOTE — MAU Provider Note (Signed)
Chief Complaint: Abdominal Pain and Headache   First Provider Initiated Contact with Patient 08/31/17 2316      SUBJECTIVE HPI: Ariana Spencer is a 31 y.o. [id number] on PPD# 4 following NSVD who presents to maternity admissions reporting sudden onset of right lower abdominal pain at 2 this afternoon while at the pediatrician with her baby.  She reports the pain is intermittent but severe sharp pain that radiates across her abdomen but starts on the right side. She has tried Tylenol and ibuprofen 200 mg PO but these have not helped.  She is afraid to use the breast pump because it causes uterine cramping and she is afraid this will be too painful.  She reports the pain is so severe that she cannot take a deep breath.  There is nausea and vomiting x 2-3 associated with the pain. She also reports an increase in bleeding today, with several golf-ball sized clots seen when she got out of the shower this morning. There are no other associated symptoms.    HPI  Past Medical History:  Diagnosis Date  . Anxiety   . Depression   . Fatigue   . Generalized headaches   . History of shingles   . Hypothyroidism   . Migraines   . Nausea and vomiting    Past Surgical History:  Procedure Laterality Date  . AUGMENTATION MAMMAPLASTY  2007  . WISDOM TOOTH EXTRACTION  2007   Social History   Socioeconomic History  . Marital status: Married    Spouse name: Not on file  . Number of children: Not on file  . Years of education: Not on file  . Highest education level: Not on file  Social Needs  . Financial resource strain: Not on file  . Food insecurity - worry: Not on file  . Food insecurity - inability: Not on file  . Transportation needs - medical: Not on file  . Transportation needs - non-medical: Not on file  Occupational History  . Occupation: Industrial/product designerCheer Coach     Employer: [employer]  Tobacco Use  . Smoking status: Never Smoker  . Smokeless tobacco: Never Used  . Tobacco comment:  Patient non-smoker  Substance and Sexual Activity  . Alcohol use: Yes    Alcohol/week: 0.0 oz    Types: 1 - 2 Glasses of wine per week    Comment: occas  . Drug use: No  . Sexual activity: Yes  Other Topics Concern  . Not on file  Social History Narrative  . Not on file   No current facility-administered medications on file prior to encounter.    Current Outpatient Medications on File Prior to Encounter  Medication Sig Dispense Refill  . acetaminophen (TYLENOL) 500 MG tablet Take 500 mg by mouth every 6 (six) hours as needed for moderate pain.    . calcium carbonate (TUMS - DOSED IN MG ELEMENTAL CALCIUM) 500 MG chewable tablet Chew 1-2 tablets by mouth daily.    . ranitidine (ZANTAC) 150 MG tablet Take 150 mg by mouth 2 (two) times daily.     No Known Allergies  ROS:  Review of Systems  Constitutional: Negative for chills, fatigue and fever.  Respiratory: Negative for shortness of breath.   Cardiovascular: Negative for chest pain.  Gastrointestinal: Positive for abdominal pain, nausea and vomiting.  Genitourinary: Positive for pelvic pain and vaginal bleeding. Negative for difficulty urinating, dysuria, flank pain, vaginal discharge and vaginal pain.  Neurological: Negative for dizziness and headaches.  Psychiatric/Behavioral: Negative.  I have reviewed patient's Past Medical Hx, Surgical Hx, Family Hx, Social Hx, medications and allergies.   Physical Exam   Patient Vitals for the past 24 hrs:  BP Temp Temp src Pulse Resp  09/01/17 0224 132/74 - - 70 16  08/31/17 2212 134/71 97.8 F (36.6 C) Oral 69 18   Constitutional: Well-developed, well-nourished female in no acute distress.  Cardiovascular: normal rate Respiratory: normal effort GI: Abd soft, RLQ tenderness with no significant rebound tenderness, no guarding. Pos BS x 4 MS: Extremities nontender, no edema, normal ROM Neurologic: Alert and oriented x 4.  GU: Neg CVAT.   Bimanual exam: Cervix 0/long/high,  firm, anterior, significant tenderness to palpation of cervix and uterus, uterus slightly enlarged, pain in RLQ makes adnexal exam technically difficult, no enlargement or mass palpable   LAB RESULTS Results for orders placed or performed during the hospital encounter of 08/31/17 (from the past 24 hour(s))  Urinalysis, Routine w reflex microscopic     Status: Abnormal   Collection Time: 08/31/17 10:10 PM  Result Value Ref Range   Color, Urine YELLOW YELLOW   APPearance HAZY (A) CLEAR   Specific Gravity, Urine 1.014 1.005 - 1.030   pH 6.0 5.0 - 8.0   Glucose, UA NEGATIVE NEGATIVE mg/dL   Hgb urine dipstick LARGE (A) NEGATIVE   Bilirubin Urine NEGATIVE NEGATIVE   Ketones, ur NEGATIVE NEGATIVE mg/dL   Protein, ur NEGATIVE NEGATIVE mg/dL   Nitrite NEGATIVE NEGATIVE   Leukocytes, UA MODERATE (A) NEGATIVE   RBC / HPF TOO NUMEROUS TO COUNT 0 - 5 RBC/hpf   WBC, UA TOO NUMEROUS TO COUNT 0 - 5 WBC/hpf   Bacteria, UA RARE (A) NONE SEEN   Squamous Epithelial / LPF 0-5 (A) NONE SEEN   Mucus PRESENT   CBC     Status: Abnormal   Collection Time: 08/31/17 10:57 PM  Result Value Ref Range   WBC 9.9 4.0 - 10.5 K/uL   RBC 3.84 (L) 3.87 - 5.11 MIL/uL   Hemoglobin 10.8 (L) 12.0 - 15.0 g/dL   HCT 16.133.4 (L) 09.636.0 - 04.546.0 %   MCV 87.0 78.0 - 100.0 fL   MCH 28.1 26.0 - 34.0 pg   MCHC 32.3 30.0 - 36.0 g/dL   RDW 40.913.5 81.111.5 - 91.415.5 %   Platelets 252 150 - 400 K/uL  Comprehensive metabolic panel     Status: Abnormal   Collection Time: 08/31/17 10:57 PM  Result Value Ref Range   Sodium 135 135 - 145 mmol/L   Potassium 4.3 3.5 - 5.1 mmol/L   Chloride 104 101 - 111 mmol/L   CO2 23 22 - 32 mmol/L   Glucose, Bld 100 (H) 65 - 99 mg/dL   BUN 20 6 - 20 mg/dL   Creatinine, Ser 7.820.88 0.44 - 1.00 mg/dL   Calcium 8.2 (L) 8.9 - 10.3 mg/dL   Total Protein 6.4 (L) 6.5 - 8.1 g/dL   Albumin 2.9 (L) 3.5 - 5.0 g/dL   AST 33 15 - 41 U/L   ALT 46 14 - 54 U/L   Alkaline Phosphatase 106 38 - 126 U/L   Total Bilirubin 0.2  (L) 0.3 - 1.2 mg/dL   GFR calc non Af Amer >60 >60 mL/min   GFR calc Af Amer >60 >60 mL/min   Anion gap 8 5 - 15  Lipase, blood     Status: None   Collection Time: 08/31/17 10:57 PM  Result Value Ref Range   Lipase 27 11 -  51 U/L    --/--/A POS (11/28 2116)  IMAGING US Pelvis (transabdominal Only)  Result Date: 09/01/2017 CLINICAL DATA:  Acute right lower quadrant pain. Vaginal delivery on 08/27/2017. EXAM: TRANSABDOMINAL ULTRASOUND OF PELVIS TECHNIQUE: Transabdominal ultrasound examination of the pelvis was performed including evaluation of the uterus, ovaries, adnexal regions, and pelvic cul-de-sac. COMPARISON:  Pelvic ultrasound 02/08/2013 FINDINGS: Uterus Measurements: 13.9 x 8.9 x 0.8 cm. No fibroids or other mass visualized. Endometrium Thickness: 5 mm.  No focal abnormality visualized. Right ovary Measurements: 2.8 x 1.3 x 1.8 cm. Normal appearance/no adnexal mass. Left ovary Measurements: 3.8 x 1.1 x 1 1 cm. Normal appearance/no adnexal mass. Other findings:  No abnormal free fluid. IMPRESSION: Normal pelvic ultrasound. Mildly enlarged uterus due to post gravid state. Electronically Signed   By: Ted Mcalpine M.D.   On: 09/01/2017 01:46    MAU Management/MDM: Ordered labs and [place] and reviewed results. There is no acute/surgical abdomen, no rebound tenderness, and WBCs wnl so appendicitis unlikely.  [place] is wnl so no indication of retained products.  Pt given Percocet 5/325 x 2 tabs in MAU with reduction but not resolution of pain.  Consult Dr Billy Coast with assessment and findings.  D/C home with Rx for Percocet 5/325, take 1-2 Q 6 hours x 10 tabs, ibuprofen 600 mg Q 6 hours PRN.  F/U in office this week. Return to MAU with worsening symptoms or signs of infection.  Pt discharged with strict pain/infection precautions.  ASSESSMENT 1. Right lower quadrant pain   2. Postpartum pain     PLAN Discharge home  Allergies as of 09/01/2017   No Known Allergies     Medication List     TAKE these medications   acetaminophen 500 MG tablet Commonly known as:  TYLENOL Take 500 mg by mouth every 6 (six) hours as needed for moderate pain.   calcium carbonate 500 MG chewable tablet Commonly known as:  TUMS - dosed in mg elemental calcium Chew 1-2 tablets by mouth daily.   ibuprofen 600 MG tablet Commonly known as:  ADVIL,MOTRIN Take 1 tablet (600 mg total) by mouth every 6 (six) hours as needed. What changed:    medication strength  how much to take   oxyCODONE-acetaminophen 5-325 MG tablet Commonly known as:  PERCOCET/ROXICET Take 1-2 tablets by mouth every 6 (six) hours as needed.   ranitidine 150 MG tablet Commonly known as:  ZANTAC Take 150 mg by mouth 2 (two) times daily.      Follow-up Information    [facility], [facility] Follow up.   Why:  As scheduled, sooner if pain persists. Return to MAU as needed for emergencies. Contact information: [address] [place] [address] [phone]           Sharen Counter Certified Nurse-Midwife 09/01/2017  3:09 AM

## 2017-09-01 DIAGNOSIS — R1031 Right lower quadrant pain: Secondary | ICD-10-CM

## 2017-09-01 MED ORDER — OXYCODONE-ACETAMINOPHEN 5-325 MG PO TABS: 1.0000 | ORAL_TABLET | Freq: Four times a day (QID) | ORAL | 0 refills | Status: DC | PRN

## 2017-09-01 MED ORDER — IBUPROFEN 600 MG PO TABS: 600.0000 mg | ORAL_TABLET | Freq: Four times a day (QID) | ORAL | 1 refills | Status: DC | PRN

## 2017-09-03 LAB — CULTURE, OB URINE

## 2017-10-14 ENCOUNTER — Other Ambulatory Visit: Payer: Self-pay | Admitting: Obstetrics and Gynecology

## 2017-10-14 NOTE — Patient Instructions (Addendum)
Your procedure is scheduled on:  Monday Jan 21  Enter through the Main Entrance of [facility] at: 6:30 am  Pick up the phone at the desk and dial [phone].  Call this number if you have problems the morning of surgery: [phone].  Remember: Do NOT eat or Do NOT drink clear liquids (including water) after midnight Sunday  Take these medicines the morning of surgery with a SIP OF WATER:  synthroid  Stop herbal medications and supplements at this time.  Do NOT wear jewelry (body piercing), metal hair clips/bobby pins, make-up, or nail polish. Do NOT wear lotions, powders, or perfumes.  You may wear deoderant. Do NOT shave for 48 hours prior to surgery. Do NOT bring valuables to the hospital.  Have a responsible adult drive you home and stay with you for 24 hours after your procedure.  Home with husband Minerva Areolaric cell [phone].

## 2017-10-16 ENCOUNTER — Other Ambulatory Visit: Payer: Self-pay

## 2017-10-16 ENCOUNTER — Encounter (HOSPITAL_COMMUNITY): Payer: Self-pay

## 2017-10-16 ENCOUNTER — Encounter (HOSPITAL_COMMUNITY)
Admission: RE | Admit: 2017-10-16 | Discharge: 2017-10-16 | Disposition: A | Discharge status: Home / Self Care | Payer: 59 | Source: Ambulatory Visit | Attending: Obstetrics and Gynecology | Admitting: Obstetrics and Gynecology

## 2017-10-16 DIAGNOSIS — Z820 Family history of epilepsy and other diseases of the nervous system: Secondary | ICD-10-CM | POA: Diagnosis not present

## 2017-10-16 DIAGNOSIS — Z8619 Personal history of other infectious and parasitic diseases: Secondary | ICD-10-CM | POA: Diagnosis not present

## 2017-10-16 DIAGNOSIS — Z302 Encounter for sterilization: Secondary | ICD-10-CM | POA: Diagnosis not present

## 2017-10-16 DIAGNOSIS — Z8049 Family history of malignant neoplasm of other genital organs: Secondary | ICD-10-CM | POA: Diagnosis not present

## 2017-10-16 DIAGNOSIS — Z800 Family history of malignant neoplasm of digestive organs: Secondary | ICD-10-CM | POA: Diagnosis not present

## 2017-10-16 DIAGNOSIS — Z803 Family history of malignant neoplasm of breast: Secondary | ICD-10-CM | POA: Diagnosis not present

## 2017-10-16 DIAGNOSIS — Z807 Family history of other malignant neoplasms of lymphoid, hematopoietic and related tissues: Secondary | ICD-10-CM | POA: Diagnosis not present

## 2017-10-16 DIAGNOSIS — Z9889 Other specified postprocedural states: Secondary | ICD-10-CM | POA: Diagnosis not present

## 2017-10-16 DIAGNOSIS — Z79890 Hormone replacement therapy (postmenopausal): Secondary | ICD-10-CM | POA: Diagnosis not present

## 2017-10-16 DIAGNOSIS — F419 Anxiety disorder, unspecified: Secondary | ICD-10-CM | POA: Diagnosis not present

## 2017-10-16 DIAGNOSIS — Z8249 Family history of ischemic heart disease and other diseases of the circulatory system: Secondary | ICD-10-CM | POA: Diagnosis not present

## 2017-10-16 DIAGNOSIS — Z818 Family history of other mental and behavioral disorders: Secondary | ICD-10-CM | POA: Diagnosis not present

## 2017-10-16 DIAGNOSIS — E039 Hypothyroidism, unspecified: Secondary | ICD-10-CM | POA: Diagnosis not present

## 2017-10-16 DIAGNOSIS — Z801 Family history of malignant neoplasm of trachea, bronchus and lung: Secondary | ICD-10-CM | POA: Diagnosis not present

## 2017-10-16 DIAGNOSIS — F329 Major depressive disorder, single episode, unspecified: Secondary | ICD-10-CM | POA: Diagnosis not present

## 2017-10-16 HISTORY — DX: Other seasonal allergic rhinitis: J30.2

## 2017-10-16 HISTORY — DX: Herpesviral infection, unspecified: B00.9

## 2017-10-16 LAB — CBC
HCT: 39.1 % (ref 36.0–46.0)
Hemoglobin: 12.9 g/dL (ref 12.0–15.0)
MCH: 27.7 pg (ref 26.0–34.0)
MCHC: 33 g/dL (ref 30.0–36.0)
MCV: 84.1 fL (ref 78.0–100.0)
PLATELETS: 238 10*3/uL (ref 150–400)
RBC: 4.65 MIL/uL (ref 3.87–5.11)
RDW: 14 % (ref 11.5–15.5)
WBC: 7.5 10*3/uL (ref 4.0–10.5)

## 2017-10-18 NOTE — Anesthesia Preprocedure Evaluation (Signed)
Anesthesia Evaluation  Patient identified by MRN, date of birth, ID band Patient awake    Reviewed: Allergy & Precautions, H&P , NPO status , Patient's Chart, lab work & pertinent test results  Airway Mallampati: III  TM Distance: >3 FB Neck ROM: full    Dental   Pulmonary    breath sounds clear to auscultation       Cardiovascular  Rhythm:regular Rate:Normal     Neuro/Psych  Headaches, PSYCHIATRIC DISORDERS Anxiety Depression  Neuromuscular disease    GI/Hepatic GERD  ,  Endo/Other  Hypothyroidism   Renal/GU      Musculoskeletal  (+) Arthritis ,   Abdominal (+) + obese,   Peds  Hematology   Anesthesia Other Findings   Reproductive/Obstetrics (+) Pregnancy                             Anesthesia Physical  Anesthesia Plan  ASA: II  Anesthesia Plan: General   Post-op Pain Management:    Induction:   PONV Risk Score and Plan: 4 or greater and Scopolamine patch - Pre-op, Ondansetron, Dexamethasone and Midazolam  Airway Management Planned: Oral ETT  Additional Equipment:   Intra-op Plan:   Post-operative Plan: Extubation in OR  Informed Consent: I have reviewed the patients History and Physical, chart, labs and discussed the procedure including the risks, benefits and alternatives for the proposed anesthesia with the patient or authorized representative who has indicated his/her understanding and acceptance.   Dental advisory given  Plan Discussed with: CRNA  Anesthesia Plan Comments:         Anesthesia Quick Evaluation

## 2017-10-19 ENCOUNTER — Ambulatory Visit (HOSPITAL_COMMUNITY): Payer: 59 | Admitting: Anesthesiology

## 2017-10-19 ENCOUNTER — Other Ambulatory Visit: Payer: Self-pay

## 2017-10-19 ENCOUNTER — Ambulatory Visit (HOSPITAL_COMMUNITY)
Admission: RE | Admit: 2017-10-19 | Discharge: 2017-10-19 | Disposition: A | Discharge status: Home / Self Care | Payer: 59 | Source: Ambulatory Visit | Attending: Obstetrics and Gynecology | Admitting: Obstetrics and Gynecology

## 2017-10-19 ENCOUNTER — Encounter (HOSPITAL_COMMUNITY): Payer: Self-pay

## 2017-10-19 ENCOUNTER — Encounter (HOSPITAL_COMMUNITY)
Admission: RE | Disposition: A | Discharge status: Home / Self Care | Payer: Self-pay | Source: Ambulatory Visit | Attending: Obstetrics and Gynecology

## 2017-10-19 DIAGNOSIS — Z800 Family history of malignant neoplasm of digestive organs: Secondary | ICD-10-CM | POA: Insufficient documentation

## 2017-10-19 DIAGNOSIS — Z302 Encounter for sterilization: Secondary | ICD-10-CM | POA: Insufficient documentation

## 2017-10-19 DIAGNOSIS — Z8619 Personal history of other infectious and parasitic diseases: Secondary | ICD-10-CM | POA: Insufficient documentation

## 2017-10-19 DIAGNOSIS — F419 Anxiety disorder, unspecified: Secondary | ICD-10-CM | POA: Insufficient documentation

## 2017-10-19 DIAGNOSIS — Z8249 Family history of ischemic heart disease and other diseases of the circulatory system: Secondary | ICD-10-CM | POA: Insufficient documentation

## 2017-10-19 DIAGNOSIS — Z803 Family history of malignant neoplasm of breast: Secondary | ICD-10-CM | POA: Insufficient documentation

## 2017-10-19 DIAGNOSIS — Z9889 Other specified postprocedural states: Secondary | ICD-10-CM | POA: Insufficient documentation

## 2017-10-19 DIAGNOSIS — Z820 Family history of epilepsy and other diseases of the nervous system: Secondary | ICD-10-CM | POA: Insufficient documentation

## 2017-10-19 DIAGNOSIS — E039 Hypothyroidism, unspecified: Secondary | ICD-10-CM | POA: Insufficient documentation

## 2017-10-19 DIAGNOSIS — Z818 Family history of other mental and behavioral disorders: Secondary | ICD-10-CM | POA: Insufficient documentation

## 2017-10-19 DIAGNOSIS — Z8049 Family history of malignant neoplasm of other genital organs: Secondary | ICD-10-CM | POA: Insufficient documentation

## 2017-10-19 DIAGNOSIS — F329 Major depressive disorder, single episode, unspecified: Secondary | ICD-10-CM | POA: Insufficient documentation

## 2017-10-19 DIAGNOSIS — Z807 Family history of other malignant neoplasms of lymphoid, hematopoietic and related tissues: Secondary | ICD-10-CM | POA: Insufficient documentation

## 2017-10-19 DIAGNOSIS — Z79890 Hormone replacement therapy (postmenopausal): Secondary | ICD-10-CM | POA: Insufficient documentation

## 2017-10-19 DIAGNOSIS — Z801 Family history of malignant neoplasm of trachea, bronchus and lung: Secondary | ICD-10-CM | POA: Insufficient documentation

## 2017-10-19 HISTORY — PX: LAPAROSCOPIC TUBAL LIGATION: SHX1937

## 2017-10-19 LAB — HCG, SERUM, QUALITATIVE: Preg, Serum: NEGATIVE

## 2017-10-19 SURGERY — LIGATION, FALLOPIAN TUBE, LAPAROSCOPIC
Anesthesia: General | Site: Abdomen | Laterality: Bilateral

## 2017-10-19 MED ORDER — CEFAZOLIN SODIUM-DEXTROSE 2-4 GM/100ML-% IV SOLN
2.0000 g | INTRAVENOUS | Status: AC
  Administered 2017-10-19: 2 g via INTRAVENOUS

## 2017-10-19 MED ORDER — BUPIVACAINE HCL (PF) 0.25 % IJ SOLN
INTRAMUSCULAR | Status: DC | PRN
  Administered 2017-10-19: 20 mL

## 2017-10-19 MED ORDER — CEFAZOLIN SODIUM-DEXTROSE 2-4 GM/100ML-% IV SOLN
INTRAVENOUS | Status: AC
Start: 2017-10-19 — End: 2017-10-19
  Filled 2017-10-19: qty 100

## 2017-10-19 MED ORDER — SUGAMMADEX SODIUM 200 MG/2ML IV SOLN
INTRAVENOUS | Status: AC
  Filled 2017-10-19: qty 2

## 2017-10-19 MED ORDER — SCOPOLAMINE 1 MG/3DAYS TD PT72
1.0000 | MEDICATED_PATCH | Freq: Once | TRANSDERMAL | Status: DC
  Administered 2017-10-19: 1.5 mg via TRANSDERMAL

## 2017-10-19 MED ORDER — SUGAMMADEX SODIUM 200 MG/2ML IV SOLN
INTRAVENOUS | Status: DC | PRN
  Administered 2017-10-19: 200 mg via INTRAVENOUS

## 2017-10-19 MED ORDER — KETOROLAC TROMETHAMINE 30 MG/ML IJ SOLN
INTRAMUSCULAR | Status: AC
  Filled 2017-10-19: qty 1

## 2017-10-19 MED ORDER — GLYCOPYRROLATE 0.2 MG/ML IJ SOLN
INTRAMUSCULAR | Status: DC | PRN
  Administered 2017-10-19: 0.2 mg via INTRAVENOUS

## 2017-10-19 MED ORDER — KETOROLAC TROMETHAMINE 30 MG/ML IJ SOLN
INTRAMUSCULAR | Status: DC | PRN
  Administered 2017-10-19: 30 mg via INTRAVENOUS

## 2017-10-19 MED ORDER — MIDAZOLAM HCL 2 MG/2ML IJ SOLN
INTRAMUSCULAR | Status: DC | PRN
  Administered 2017-10-19: 2 mg via INTRAVENOUS

## 2017-10-19 MED ORDER — OXYCODONE HCL 5 MG PO TABS: 5.0000 mg | ORAL_TABLET | Freq: Once | ORAL | Status: DC | PRN

## 2017-10-19 MED ORDER — SCOPOLAMINE 1 MG/3DAYS TD PT72
MEDICATED_PATCH | TRANSDERMAL | Status: AC
  Filled 2017-10-19: qty 1

## 2017-10-19 MED ORDER — BUPIVACAINE HCL (PF) 0.25 % IJ SOLN
INTRAMUSCULAR | Status: AC
  Filled 2017-10-19: qty 30

## 2017-10-19 MED ORDER — FENTANYL CITRATE (PF) 100 MCG/2ML IJ SOLN
INTRAMUSCULAR | Status: DC | PRN
  Administered 2017-10-19: 100 ug via INTRAVENOUS
  Administered 2017-10-19 (×3): 50 ug via INTRAVENOUS

## 2017-10-19 MED ORDER — OXYCODONE-ACETAMINOPHEN 5-325 MG PO TABS: 1.0000 | ORAL_TABLET | Freq: Four times a day (QID) | ORAL | 0 refills | Status: DC | PRN

## 2017-10-19 MED ORDER — ROCURONIUM BROMIDE 100 MG/10ML IV SOLN
INTRAVENOUS | Status: AC
  Filled 2017-10-19: qty 1

## 2017-10-19 MED ORDER — DEXAMETHASONE SODIUM PHOSPHATE 10 MG/ML IJ SOLN
INTRAMUSCULAR | Status: DC | PRN
  Administered 2017-10-19: 10 mg via INTRAVENOUS

## 2017-10-19 MED ORDER — LACTATED RINGERS IV SOLN
INTRAVENOUS | Status: DC
  Administered 2017-10-19: 125 mL/h via INTRAVENOUS

## 2017-10-19 MED ORDER — ONDANSETRON HCL 4 MG/2ML IJ SOLN
INTRAMUSCULAR | Status: DC | PRN
  Administered 2017-10-19: 4 mg via INTRAVENOUS

## 2017-10-19 MED ORDER — KETOROLAC TROMETHAMINE 30 MG/ML IJ SOLN: 30.0000 mg | Freq: Once | INTRAMUSCULAR | Status: DC | PRN

## 2017-10-19 MED ORDER — MIDAZOLAM HCL 2 MG/2ML IJ SOLN
INTRAMUSCULAR | Status: AC
  Filled 2017-10-19: qty 2

## 2017-10-19 MED ORDER — MEPERIDINE HCL 25 MG/ML IJ SOLN: 6.2500 mg | INTRAMUSCULAR | Status: DC | PRN

## 2017-10-19 MED ORDER — PROMETHAZINE HCL 25 MG/ML IJ SOLN: 6.2500 mg | INTRAMUSCULAR | Status: DC | PRN

## 2017-10-19 MED ORDER — HYDROMORPHONE HCL 1 MG/ML IJ SOLN: 0.2500 mg | INTRAMUSCULAR | Status: DC | PRN

## 2017-10-19 MED ORDER — LIDOCAINE HCL (CARDIAC) 20 MG/ML IV SOLN
INTRAVENOUS | Status: DC | PRN
  Administered 2017-10-19: 100 mg via INTRAVENOUS

## 2017-10-19 MED ORDER — FENTANYL CITRATE (PF) 250 MCG/5ML IJ SOLN
INTRAMUSCULAR | Status: AC
  Filled 2017-10-19: qty 5

## 2017-10-19 MED ORDER — OXYCODONE HCL 5 MG/5ML PO SOLN: 5.0000 mg | Freq: Once | ORAL | Status: DC | PRN

## 2017-10-19 MED ORDER — PROPOFOL 10 MG/ML IV BOLUS
INTRAVENOUS | Status: DC | PRN
  Administered 2017-10-19: 200 mg via INTRAVENOUS

## 2017-10-19 MED ORDER — PROPOFOL 10 MG/ML IV BOLUS
INTRAVENOUS | Status: AC
  Filled 2017-10-19: qty 20

## 2017-10-19 MED ORDER — NEOSTIGMINE METHYLSULFATE 10 MG/10ML IV SOLN
INTRAVENOUS | Status: AC
  Filled 2017-10-19: qty 1

## 2017-10-19 MED ORDER — ONDANSETRON HCL 4 MG/2ML IJ SOLN
INTRAMUSCULAR | Status: AC
  Filled 2017-10-19: qty 2

## 2017-10-19 MED ORDER — ROCURONIUM BROMIDE 100 MG/10ML IV SOLN
INTRAVENOUS | Status: DC | PRN
  Administered 2017-10-19: 40 mg via INTRAVENOUS

## 2017-10-19 MED ORDER — LIDOCAINE HCL (CARDIAC) 20 MG/ML IV SOLN
INTRAVENOUS | Status: AC
  Filled 2017-10-19: qty 5

## 2017-10-19 MED ORDER — GLYCOPYRROLATE 0.2 MG/ML IJ SOLN
INTRAMUSCULAR | Status: AC
  Filled 2017-10-19: qty 1

## 2017-10-19 MED ORDER — DEXAMETHASONE SODIUM PHOSPHATE 10 MG/ML IJ SOLN
INTRAMUSCULAR | Status: AC
  Filled 2017-10-19: qty 1

## 2017-10-19 SURGICAL SUPPLY — 24 items
ADH SKN CLS APL DERMABOND .7 (GAUZE/BANDAGES/DRESSINGS) ×1
CATH ROBINSON RED A/P 16FR (CATHETERS) ×3 IMPLANT
DERMABOND ADVANCED (GAUZE/BANDAGES/DRESSINGS) ×2
DERMABOND ADVANCED .7 DNX12 (GAUZE/BANDAGES/DRESSINGS) ×1 IMPLANT
DRSG OPSITE POSTOP 3X4 (GAUZE/BANDAGES/DRESSINGS) ×2 IMPLANT
DURAPREP 26ML APPLICATOR (WOUND CARE) ×3 IMPLANT
GLOVE BIO SURGEON STRL SZ7.5 (GLOVE) ×6 IMPLANT
GLOVE BIOGEL PI IND STRL 7.0 (GLOVE) ×2 IMPLANT
GLOVE BIOGEL PI INDICATOR 7.0 (GLOVE) ×4
GOWN STRL REUS W/TWL LRG LVL3 (GOWN DISPOSABLE) ×6 IMPLANT
NEEDLE INSUFFLATION 120MM (ENDOMECHANICALS) ×3 IMPLANT
PACK LAPAROSCOPY BASIN (CUSTOM PROCEDURE TRAY) ×3 IMPLANT
PACK TRENDGUARD 450 HYBRID PRO (MISCELLANEOUS) IMPLANT
PACK TRENDGUARD 600 HYBRD PROC (MISCELLANEOUS) IMPLANT
PROTECTOR NERVE ULNAR (MISCELLANEOUS) ×6 IMPLANT
SOLUTION ELECTROLUBE (MISCELLANEOUS) ×3 IMPLANT
SUT VICRYL 0 UR6 27IN ABS (SUTURE) ×3 IMPLANT
SUT VICRYL 4-0 PS2 18IN ABS (SUTURE) IMPLANT
TOWEL OR 17X24 6PK STRL BLUE (TOWEL DISPOSABLE) ×6 IMPLANT
TRENDGUARD 450 HYBRID PRO PACK (MISCELLANEOUS) ×3
TRENDGUARD 600 HYBRID PROC PK (MISCELLANEOUS)
TROCAR OPTI TIP 5M 100M (ENDOMECHANICALS) IMPLANT
TROCAR XCEL DIL TIP R 11M (ENDOMECHANICALS) ×3 IMPLANT
WARMER LAPAROSCOPE (MISCELLANEOUS) ×3 IMPLANT

## 2017-10-19 NOTE — Op Note (Signed)
10/19/2017  8:51 AM  PATIENT:  Ariana RumpfAmanda N Spencer  32 y.o. female  PRE-OPERATIVE DIAGNOSIS:  Desires Sterilization  POST-OPERATIVE DIAGNOSIS:  desires sterilization  PROCEDURE:  Procedure(s): LAPAROSCOPIC TUBAL LIGATION with BIPOLAR CAUTERY  SURGEON:  Surgeon(s): Olivia Mackieaavon, Colbert Curenton, MD  ASSISTANTS: none   ANESTHESIA:   local  ESTIMATED BLOOD LOSS: minimal  DRAINS: none   LOCAL MEDICATIONS USED:  MARCAINE     SPECIMEN:  No Specimen  DISPOSITION OF SPECIMEN:  N/A  COUNTS:  YES  DICTATION #: [id number]  PLAN OF CARE: dc home  PATIENT DISPOSITION:  PACU - hemodynamically stable.

## 2017-10-19 NOTE — Progress Notes (Signed)
Patient ID: Ariana Spencer, female   DOB: [birth year], 32 y.o.   MRN: [id number] Patient seen and examined. Consent witnessed and signed. No changes noted. Update completed.

## 2017-10-19 NOTE — Transfer of Care (Signed)
Immediate Anesthesia Transfer of Care Note  Patient: Ariana Spencer  Procedure(s) Performed: LAPAROSCOPIC TUBAL LIGATION (Bilateral Abdomen)  Patient Location: PACU  Anesthesia Type:General  Level of Consciousness: awake, alert  and oriented  Airway & Oxygen Therapy: Patient Spontanous Breathing and Patient connected to nasal cannula oxygen  Post-op Assessment: Report given to RN, Post -op Vital signs reviewed and stable and Patient moving all extremities X 4  Post vital signs: Reviewed and stable  Last Vitals:  Vitals:   10/19/17 0656 10/19/17 0901  BP: 114/70 115/61  Pulse: 68 68  Resp: 16 12  Temp: 36.6 C   SpO2: 99% 100%    Last Pain:  Vitals:   10/19/17 0656  TempSrc: Oral      Patients Stated Pain Goal: 3 (10/19/17 0656)  Complications: No apparent anesthesia complications

## 2017-10-19 NOTE — H&P (Signed)
Ariana Spencer is an 32 y.o. female. Desires elective sterilization.   Pertinent Gynecological History: Menses: flow is moderate Bleeding: na Contraception: none DES exposure: denies Blood transfusions: none Sexually transmitted diseases: no past history Previous GYN Procedures: na  Last mammogram: na Date: na Last pap: normal Date: 2018 OB History: G2, P2   Menstrual History: Menarche age: [age] Patient's last menstrual period was 12/16/2016.    Past Medical History:  Diagnosis Date  . Anxiety    no meds  . Depression    no meds  . Fatigue   . Generalized headaches   . History of shingles   . HSV infection   . Hypothyroidism   . Migraines    last on 02/2017  . Nausea and vomiting   . Seasonal allergies   . SVD (spontaneous vaginal delivery)    x 2    Past Surgical History:  Procedure Laterality Date  . AUGMENTATION MAMMAPLASTY  2007  . EYE SURGERY Bilateral    Lasik  . FLEXIBLE SIGMOIDOSCOPY    . UPPER GI ENDOSCOPY    . WISDOM TOOTH EXTRACTION  2007    Family History  Problem Relation Age of Onset  . Hypertension Father   . Cancer Maternal Grandmother        ovarian or uterine cancer and throat cancer  . Breast cancer Maternal Grandmother [id number]  . Lung cancer Paternal Grandfather        lung  . Lymphoma Paternal Grandfather   . Cancer Paternal Grandfather        lung  . Cancer Maternal Grandfather        lung  . Alzheimer's disease Paternal Grandmother   . Bipolar disorder Paternal Uncle   . Colon cancer Neg Hx     Social History:  reports that  has never smoked. she has never used smokeless tobacco. She reports that she drinks alcohol. She reports that she does not use drugs.  Allergies: No Known Allergies  Medications Prior to Admission  Medication Sig Dispense Refill Last Dose  . acetaminophen (TYLENOL) 500 MG tablet Take 500 mg by mouth every 6 (six) hours as needed for moderate pain.   Past Week at Unknown time  . levothyroxine (SYNTHROID,  LEVOTHROID) 50 MCG tablet Take 50 mcg by mouth daily.  6 10/19/2017 at 0600  . calcium carbonate (TUMS - DOSED IN MG ELEMENTAL CALCIUM) 500 MG chewable tablet Chew 1-2 tablets by mouth daily.   Past Month at Unknown time  . ibuprofen (ADVIL,MOTRIN) 600 MG tablet Take 1 tablet (600 mg total) by mouth every 6 (six) hours as needed. (Patient not taking: Reported on 10/14/2017) 30 tablet 1 Not Taking at Unknown time  . oxyCODONE-acetaminophen (PERCOCET/ROXICET) 5-325 MG tablet Take 1-2 tablets by mouth every 6 (six) hours as needed. (Patient not taking: Reported on 10/14/2017) 10 tablet 0 Not Taking at Unknown time    Review of Systems  Constitutional: Negative.   All other systems reviewed and are negative.   Last menstrual period 12/16/2016, currently breastfeeding. Physical Exam  Nursing note and vitals reviewed. Constitutional: She is oriented to person, place, and time. She appears well-developed and well-nourished.  HENT:  Head: Normocephalic and atraumatic.  Neck: Normal range of motion. Neck supple.  Cardiovascular: Normal rate and regular rhythm.  Respiratory: Effort normal and breath sounds normal.  GI: Soft. Bowel sounds are normal.  Genitourinary: Vagina normal and uterus normal.  Musculoskeletal: Normal range of motion.  Neurological: She is alert and oriented to  person, place, and time. She has normal reflexes.  Skin: Skin is warm and dry.  Psychiatric: She has a normal mood and affect.    No results found for this or any previous visit (from the past 24 hour(s)).  No results found.  Assessment/Plan: Desire for elective sterilization LTL Surgical risks noted. Failure risks discussed. Consent done.  Neria Procter J 10/19/2017, 6:55 AM

## 2017-10-19 NOTE — Anesthesia Procedure Notes (Signed)
Procedure Name: Intubation Date/Time: 10/19/2017 7:18 AM Performed by: Nolon Nations, MD Pre-anesthesia Checklist: Patient identified, Patient being monitored, Timeout performed, Emergency Drugs available and Suction available Patient Re-evaluated:Patient Re-evaluated prior to induction Oxygen Delivery Method: Circle System Utilized Preoxygenation: Pre-oxygenation with 100% oxygen Induction Type: IV induction Ventilation: Mask ventilation without difficulty Laryngoscope Size: Mac and 2 Grade View: Grade II Tube type: Oral Tube size: 7.0 mm Number of attempts: 1 Airway Equipment and Method: stylet Placement Confirmation: ETT inserted through vocal cords under direct vision,  positive ETCO2 and breath sounds checked- equal and bilateral Secured at: 22 cm Tube secured with: Tape Dental Injury: Teeth and Oropharynx as per pre-operative assessment

## 2017-10-19 NOTE — Discharge Instructions (Signed)

## 2017-10-19 NOTE — Op Note (Signed)
NAMJerel Spencer:  Lafoe, Careena              ACCOUNT NO.:  [id number]  MEDICAL RECORD NO.:  [id number]  LOCATION:                                 FACILITY:  PHYSICIAN:  Lenoard Adenichard J. Shary Lamos, M.D.DATE OF BIRTH:  08-07-1986  DATE OF PROCEDURE: DATE OF DISCHARGE:                              OPERATIVE REPORT   PREOPERATIVE DIAGNOSIS:  Desire for elective sterilization.  POSTOPERATIVE DIAGNOSIS:  Desire for elective sterilization.  PROCEDURES:  Laparoscopic tubal ligation, bipolar cautery.  SURGEON:  Lenoard Adenichard J. Samuele Storey, MD.  ASSISTANT:  None.  ANESTHESIA:  Local and general.  ESTIMATED BLOOD LOSS:  Less than 50 mL.  DRAINS:  None.  COUNTS:  Correct.  The patient to recovery in good condition.  SPECIMEN:  No specimen.  BRIEF OPERATIVE NOTE:  After being apprised of risks of anesthesia, infection, bleeding, injury to surrounding organs, possible need for repair, delayed versus immediate complications to include bowel and bladder injury, possible need for repair.  The patient was brought to the operating room where she was administered general anesthetic without complications, prepped and draped in usual sterile fashion. Catheterized until the bladder was empty.  Hulka tenaculum was placed vaginally without difficulty.  Exam under anesthesia was anteflexed uterus.  No adnexal masses appreciated.  At this time, infraumbilical incision made with a scalpel.  Veress needle placed.  Opening pressure - 1.  Three liters of CO2 insufflated without difficulty.  Trocar placed atraumatically.  Pictures taken.  Visualization revealed normal liver and gallbladder bed, normal diaphragm, normal appendiceal area, normal anterior and posterior cul-de-sac, normal tubes, normal ovaries.  At this time, the Kleppinger bipolar cautery was entered through the operative port of the laparoscope and right tube traced out to the fimbriated end and ampullary-isthmic portion of the tube was cauterized using bipolar  cautery to resistance of 0 in 3 contiguous areas. Similarly on the left, tube was traced out to the fimbriated end and ampullary-isthmic portion of the tube was bipolar, coagulated in 3 contiguous areas to resistance of 0.  Good hemostasis was noted.  Both tubes were divided using hook scissors and tubal lumens were visualized. CO2 was released and visualization revealed no evidence of any active bleeding.  At this time, CO2 was released.  Positive pressure applied. All instruments removed under direct visualization.  Incision closed using 0 Vicryl, 4-0 Vicryl, Dermabond. Dilute Marcaine solution placed.  The patient tolerated the procedure well, was awakened and transferred to recovery after removal of the Hulka tenaculum.  She was transferred without complication.     Lenoard Adenichard J. Demoni Gergen, M.D.     RJT/MEDQ  D:  10/19/2017  T:  10/19/2017  Job:  [id number]

## 2017-10-20 NOTE — Anesthesia Postprocedure Evaluation (Signed)
Anesthesia Post Note  Patient: Ariana Spencer  Procedure(s) Performed: LAPAROSCOPIC TUBAL LIGATION (Bilateral Abdomen)     Patient location during evaluation: PACU Anesthesia Type: General Level of consciousness: sedated and patient cooperative Pain management: pain level controlled Vital Signs Assessment: post-procedure vital signs reviewed and stable Respiratory status: spontaneous breathing Cardiovascular status: stable Anesthetic complications: no    Last Vitals:  Vitals:   10/19/17 0945 10/19/17 0955  BP: 105/63 104/69  Pulse: (!) 56 (!) 53  Resp: 14 15  Temp:    SpO2: 97% 96%    Last Pain:  Vitals:   10/19/17 0656  TempSrc: Oral                 Lewie LoronJohn Buell Parcel

## 2017-10-21 ENCOUNTER — Encounter (HOSPITAL_COMMUNITY): Payer: Self-pay | Admitting: Obstetrics and Gynecology

## 2019-04-11 DIAGNOSIS — Z0000 Encounter for general adult medical examination without abnormal findings: Secondary | ICD-10-CM | POA: Diagnosis not present

## 2019-04-11 DIAGNOSIS — R42 Dizziness and giddiness: Secondary | ICD-10-CM | POA: Diagnosis not present

## 2019-04-11 DIAGNOSIS — E039 Hypothyroidism, unspecified: Secondary | ICD-10-CM | POA: Diagnosis not present

## 2019-04-11 DIAGNOSIS — Z01419 Encounter for gynecological examination (general) (routine) without abnormal findings: Secondary | ICD-10-CM | POA: Diagnosis not present

## 2019-04-11 DIAGNOSIS — Z6836 Body mass index (BMI) 36.0-36.9, adult: Secondary | ICD-10-CM | POA: Diagnosis not present

## 2019-04-18 DIAGNOSIS — L659 Nonscarring hair loss, unspecified: Secondary | ICD-10-CM | POA: Diagnosis not present

## 2019-06-01 DIAGNOSIS — R7989 Other specified abnormal findings of blood chemistry: Secondary | ICD-10-CM | POA: Diagnosis not present

## 2019-06-13 ENCOUNTER — Emergency Department: Payer: BC Managed Care – PPO

## 2019-06-13 ENCOUNTER — Emergency Department
Admission: EM | Admit: 2019-06-13 | Discharge: 2019-06-14 | Disposition: A | Discharge status: Home / Self Care | Payer: BC Managed Care – PPO | Attending: Emergency Medicine | Admitting: Emergency Medicine

## 2019-06-13 ENCOUNTER — Other Ambulatory Visit: Payer: Self-pay

## 2019-06-13 DIAGNOSIS — Z79899 Other long term (current) drug therapy: Secondary | ICD-10-CM | POA: Insufficient documentation

## 2019-06-13 DIAGNOSIS — R1011 Right upper quadrant pain: Secondary | ICD-10-CM | POA: Diagnosis not present

## 2019-06-13 DIAGNOSIS — R109 Unspecified abdominal pain: Secondary | ICD-10-CM | POA: Diagnosis not present

## 2019-06-13 DIAGNOSIS — R1013 Epigastric pain: Secondary | ICD-10-CM

## 2019-06-13 DIAGNOSIS — E039 Hypothyroidism, unspecified: Secondary | ICD-10-CM | POA: Diagnosis not present

## 2019-06-13 DIAGNOSIS — R112 Nausea with vomiting, unspecified: Secondary | ICD-10-CM | POA: Diagnosis not present

## 2019-06-13 LAB — COMPREHENSIVE METABOLIC PANEL
ALT: 14 U/L (ref 0–44)
AST: 14 U/L — ABNORMAL LOW (ref 15–41)
Albumin: 4.7 g/dL (ref 3.5–5.0)
Alkaline Phosphatase: 43 U/L (ref 38–126)
Anion gap: 14 (ref 5–15)
BUN: 10 mg/dL (ref 6–20)
CO2: 21 mmol/L — ABNORMAL LOW (ref 22–32)
Calcium: 9.1 mg/dL (ref 8.9–10.3)
Chloride: 102 mmol/L (ref 98–111)
Creatinine, Ser: 0.87 mg/dL (ref 0.44–1.00)
GFR calc Af Amer: >60 mL/min (ref >60)
GFR calc non Af Amer: >60 mL/min (ref >60)
Glucose, Bld: 78 mg/dL (ref 70–99)
Potassium: 3.7 mmol/L (ref 3.5–5.1)
Sodium: 137 mmol/L (ref 135–145)
Total Bilirubin: 1.2 mg/dL (ref 0.3–1.2)
Total Protein: 7.9 g/dL (ref 6.5–8.1)

## 2019-06-13 LAB — URINALYSIS, COMPLETE (UACMP) WITH MICROSCOPIC
Bacteria, UA: NONE SEEN
Bilirubin Urine: NEGATIVE
Glucose, UA: NEGATIVE mg/dL
Ketones, ur: 20 mg/dL — AB
Leukocytes,Ua: NEGATIVE
Nitrite: NEGATIVE
Protein, ur: NEGATIVE mg/dL
Specific Gravity, Urine: 1.008 (ref 1.005–1.030)
pH: 5 (ref 5.0–8.0)

## 2019-06-13 LAB — CBC
HCT: 42.8 % (ref 36.0–46.0)
Hemoglobin: 13.8 g/dL (ref 12.0–15.0)
MCH: 28 pg (ref 26.0–34.0)
MCHC: 32.2 g/dL (ref 30.0–36.0)
MCV: 86.8 fL (ref 80.0–100.0)
Platelets: 226 10*3/uL (ref 150–400)
RBC: 4.93 MIL/uL (ref 3.87–5.11)
RDW: 13.5 % (ref 11.5–15.5)
WBC: 6.7 10*3/uL (ref 4.0–10.5)
nRBC: 0 % (ref 0.0–0.2)

## 2019-06-13 LAB — POCT PREGNANCY, URINE: Preg Test, Ur: NEGATIVE

## 2019-06-13 LAB — LIPASE, BLOOD: Lipase: 24 U/L (ref 11–51)

## 2019-06-13 MED ORDER — DICYCLOMINE HCL 10 MG/5ML PO SOLN
10.0000 mg | Freq: Once | ORAL | Status: AC
  Administered 2019-06-14: 01:00:00 10 mg via ORAL
  Filled 2019-06-13 (×3): qty 5

## 2019-06-13 MED ORDER — MORPHINE SULFATE (PF) 4 MG/ML IV SOLN
4.0000 mg | Freq: Once | INTRAVENOUS | Status: AC
Start: 2019-06-13 — End: 2019-06-13
  Administered 2019-06-13: 22:00:00 4 mg via INTRAVENOUS
  Filled 2019-06-13: qty 1

## 2019-06-13 MED ORDER — ALUM & MAG HYDROXIDE-SIMETH 200-200-20 MG/5ML PO SUSP
30.0000 mL | Freq: Once | ORAL | Status: AC
  Administered 2019-06-13: 23:00:00 30 mL via ORAL
  Filled 2019-06-13: qty 30

## 2019-06-13 MED ORDER — PANTOPRAZOLE SODIUM 40 MG PO TBEC: 40.0000 mg | DELAYED_RELEASE_TABLET | Freq: Every day | ORAL | 1 refills | Status: AC

## 2019-06-13 MED ORDER — LIDOCAINE VISCOUS HCL 2 % MT SOLN
15.0000 mL | Freq: Once | OROMUCOSAL | Status: AC
  Administered 2019-06-13: 23:00:00 15 mL via ORAL
  Filled 2019-06-13: qty 15

## 2019-06-13 MED ORDER — DICYCLOMINE HCL 10 MG/5ML PO SOLN
10.0000 mg | Freq: Once | ORAL | Status: DC
  Filled 2019-06-13: qty 5

## 2019-06-13 MED ORDER — ONDANSETRON HCL 4 MG/2ML IJ SOLN
4.0000 mg | Freq: Once | INTRAMUSCULAR | Status: AC
  Administered 2019-06-13: 22:00:00 4 mg via INTRAVENOUS
  Filled 2019-06-13: qty 2

## 2019-06-13 NOTE — ED Notes (Signed)
Iv started by iv team.  meds given. Pt reports being on keto diet and fasting since 2100 last night.  25 pound weight loss during past 2 months

## 2019-06-13 NOTE — ED Notes (Signed)
Unable to get iv.  md aware.  Iv team consult ordered.

## 2019-06-13 NOTE — ED Provider Notes (Signed)
[facility] Emergency Department Provider Note   ____________________________________________   First MD Initiated Contact with Patient 06/13/19 2046     (approximate)  I have reviewed the triage vital signs and the nursing notes.   HISTORY  Chief Complaint Abdominal Pain   HPI Ariana Spencer is a 33 y.o. female who complains of upper abdominal pain with some vomiting.  She has no diarrhea.  No pain in the back.  No fever.  She has not eaten today because eating seems to make the pain worse.  Pain is deep and achy worse with movement and apparently also worse with eating.         Past Medical History:  Diagnosis Date  . Anxiety    no meds  . Depression    no meds  . Fatigue   . Generalized headaches   . History of shingles   . HSV infection   . Hypothyroidism   . Migraines    last on 02/2017  . Nausea and vomiting   . Seasonal allergies   . SVD (spontaneous vaginal delivery)    x 2    Patient Active Problem List   Diagnosis Date Noted  . SVD (spontaneous vaginal delivery) 08/27/2017  . Postpartum care following vaginal delivery (11/29) 08/27/2017  . Second-degree perineal laceration, with delivery 08/27/2017  . Preterm delivery, delivered 08/27/2017  . Indication for care in labor or delivery 08/26/2017  . Generalized anxiety disorder 10/23/2016  . Pregnant 02/23/2013  . Hyperemesis arising during pregnancy 02/23/2013  . Female pelvic pain 12/21/2012  . History of ovarian cyst 12/21/2012  . Oily skin 12/21/2012  . Diarrhea 10/14/2011  . Nausea & vomiting 10/14/2011  . GERD (gastroesophageal reflux disease) 10/14/2011  . Herpes zoster   . CONTACT DERMATITIS DUE TO POISON IVY 03/09/2009  . GASTROENTERITIS 10/19/2008  . MIGRAINE, CHRONIC 05/01/2008  . ACUTE MAXILLARY SINUSITIS 05/01/2008  . HEADACHE 05/01/2008  . ALLERGIC CONJUNCTIVITIS 01/26/2008  . OSTEOARTHROSIS, LOCAL NOS, LOWER LEG 05/14/2007  . MYALGIA 03/10/2007     Past Surgical History:  Procedure Laterality Date  . AUGMENTATION MAMMAPLASTY  2007  . EYE SURGERY Bilateral    Lasik  . FLEXIBLE SIGMOIDOSCOPY    . LAPAROSCOPIC TUBAL LIGATION Bilateral 10/19/2017   Procedure: LAPAROSCOPIC TUBAL LIGATION;  Surgeon: Brien Few, MD;  Location: [facility];  Service: Gynecology;  Laterality: Bilateral;  . UPPER GI ENDOSCOPY    . [place] EXTRACTION  2007    Prior to Admission medications   Medication Sig Start Date End Date Taking? Authorizing Provider  acetaminophen (TYLENOL) 500 MG tablet Take 500 mg by mouth every 6 (six) hours as needed for moderate pain.    [provider]  calcium carbonate (TUMS - DOSED IN MG ELEMENTAL CALCIUM) 500 MG chewable tablet Chew 1-2 tablets by mouth daily.    [provider]  ibuprofen (ADVIL,MOTRIN) 600 MG tablet Take 1 tablet (600 mg total) by mouth every 6 (six) hours as needed. Patient not taking: Reported on 10/14/2017 09/01/17   Fatima Blank A, CNM  levothyroxine (SYNTHROID, LEVOTHROID) 50 MCG tablet Take 50 mcg by mouth daily. 10/08/17   [provider]  oxyCODONE-acetaminophen (PERCOCET/ROXICET) 5-325 MG tablet Take 1-2 tablets by mouth every 6 (six) hours as needed. 10/19/17   Brien Few, MD  pantoprazole (PROTONIX) 40 MG tablet Take 1 tablet (40 mg total) by mouth daily. 06/13/19 06/12/20  Nena Polio, MD    Allergies Patient has no known allergies.  Family History  Problem Relation Age of Onset  . Hypertension Father   . Cancer Maternal Grandmother        ovarian or uterine cancer and throat cancer  . Breast cancer Maternal Grandmother 72  . Lung cancer Paternal Grandfather        lung  . Lymphoma Paternal Grandfather   . Cancer Paternal Grandfather        lung  . Cancer Maternal Grandfather        lung  . Alzheimer's disease Paternal Grandmother   . Bipolar disorder Paternal Uncle   . Colon cancer Neg Hx     Social History Social History   Tobacco Use   . Smoking status: Never Smoker  . Smokeless tobacco: Never Used  . Tobacco comment: Patient non-smoker  Substance Use Topics  . Alcohol use: Yes    Alcohol/week: 1.0 - 2.0 standard drinks    Types: 1 - 2 Glasses of wine per week    Comment: None with pregnancy/breast feeding  . Drug use: No    Review of Systems  Constitutional: No fever/chills Eyes: No visual changes. ENT: No sore throat. Cardiovascular: Denies chest pain. Respiratory: Denies shortness of breath. Gastrointestinal:  abdominal pain.   nausea, no vomiting.  No diarrhea.  No constipation. Genitourinary: Negative for dysuria. Musculoskeletal: Negative for back pain. Skin: Negative for rash. Neurological: Negative for headaches, focal weakness  ____________________________________________   PHYSICAL EXAM:  VITAL SIGNS: ED Triage Vitals [06/13/19 2002]  Enc Vitals Group     BP 129/85     Pulse Rate 70     Resp 20     Temp 98 F (36.7 C)     Temp Source Oral     SpO2 100 %     Weight 188 lb (85.3 kg)     Height 5\' 4"  (1.626 m)     Head Circumference      Peak Flow      Pain Score 7     Pain Loc      Pain Edu?      Excl. in GC?     Constitutional: Alert and oriented. Well appearing and in no acute distress. Eyes: Conjunctivae are normal.  Head: Atraumatic. Nose: No congestion/rhinnorhea. Mouth/Throat: Mucous membranes are moist.  Oropharynx non-erythematous. Neck: No stridor.  Cardiovascular: Normal rate, regular rhythm. Grossly normal heart sounds.  Good peripheral circulation. Respiratory: Normal respiratory effort.  No retractions. Lungs CTAB. Gastrointestinal: Soft tender to palpation percussion across the upper abdomen especially in the right upper quadrant where the pain is much worse.  No distention. No abdominal bruits. No CVA tenderness. Musculoskeletal: No lower extremity tenderness nor edema.   Neurologic:  Normal speech and language. No gross focal neurologic deficits are appreciated. .  Skin:  Skin is warm, dry and intact. No rash noted.   ____________________________________________   LABS (all labs ordered are listed, but only abnormal results are displayed)  Labs Reviewed  COMPREHENSIVE METABOLIC PANEL - Abnormal; Notable for the following components:      Result Value   CO2 21 (*)    AST 14 (*)    All other components within normal limits  URINALYSIS, COMPLETE (UACMP) WITH MICROSCOPIC - Abnormal; Notable for the following components:   Color, Urine STRAW (*)    APPearance CLEAR (*)    Hgb urine dipstick MODERATE (*)    Ketones, ur 20 (*)    All other components within normal limits  CBC  LIPASE, BLOOD  POC URINE  PREG, ED  POCT PREGNANCY, URINE   ____________________________________________  EKG   ____________________________________________  RADIOLOGY  ED MD interpretation:  Official radiology report(s): Dg Chest Portable 1 View  Result Date: 06/13/2019 CLINICAL DATA:  Right upper quadrant and epigastric pain EXAM: PORTABLE CHEST 1 VIEW COMPARISON:  10/03/2011 FINDINGS: The heart size and mediastinal contours are within normal limits. Both lungs are clear. The visualized skeletal structures are unremarkable. IMPRESSION: No active disease. Electronically Signed   By: Jasmine PangKim  Fujinaga M.D.   On: 06/13/2019 21:35   [place] Abdomen Limited Ruq  Result Date: 06/13/2019 CLINICAL DATA:  Right upper quadrant pain for several days EXAM: ULTRASOUND ABDOMEN LIMITED RIGHT UPPER QUADRANT COMPARISON:  None. FINDINGS: Gallbladder: No gallstones or wall thickening visualized. No sonographic Murphy sign noted by sonographer. Common bile duct: Diameter: 3.7 mm. Liver: No focal lesion identified. Within normal limits in parenchymal echogenicity. Portal vein is patent on color Doppler imaging with normal direction of blood flow towards the liver. Other: None. IMPRESSION: No acute abnormality noted. Electronically Signed   By: Alcide CleverMark  Lukens M.D.   On: 06/13/2019 22:06     ____________________________________________   PROCEDURES  Procedure(s) performed (including Critical Care):  Procedures   ____________________________________________   INITIAL IMPRESSION / ASSESSMENT AND PLAN / ED COURSE  Patient's pain resolved with GI cocktail.  We will let her have some Protonix follow-up with her doctor. Ariana Spencer was evaluated in Emergency Department on 06/14/2019 for the symptoms described in the history of present illness. She was evaluated in the context of the global COVID-19 pandemic, which necessitated consideration that the patient might be at risk for infection with the SARS-CoV-2 virus that causes COVID-19. Institutional protocols and algorithms that pertain to the evaluation of patients at risk for COVID-19 are in a state of rapid change based on information released by regulatory bodies including the CDC and federal and state organizations. These policies and algorithms were followed during the patient's care in the ED.              ____________________________________________   FINAL CLINICAL IMPRESSION(S) / ED DIAGNOSES  Final diagnoses:  Epigastric pain     ED Discharge Orders         Ordered    pantoprazole (PROTONIX) 40 MG tablet  Daily     06/13/19 2339           Note:  This document was prepared using Dragon voice recognition software and may include unintentional dictation errors.    Arnaldo NatalMalinda, Yaniah Thiemann F, MD 06/14/19 [phone]

## 2019-06-13 NOTE — ED Notes (Signed)
report off to tom rn

## 2019-06-13 NOTE — Discharge Instructions (Addendum)
Please return for worse pain, fever or vomiting.  Try the Protonix 1 a day.  Follow-up with your regular doctor in a week or 2 to see how you are doing.  It is likely that you had some stomach irritation likely gastritis which cause this pain.  The Protonix should make that go away.

## 2019-06-13 NOTE — ED Notes (Signed)
Pt reports upper abd pain since noon today.  Vomited x 3 today.  No diarrhea.  Pt states pain radiates into right side of abdomen.  No back pain.  Pt alert   Family in with pt.

## 2019-06-13 NOTE — ED Triage Notes (Signed)
Pt in with co epigastric pain that radiates to mid abd and to ruq. States started a few days ago but not improving. Has vomited x 3 today, no diarrhea.

## 2019-06-13 NOTE — ED Notes (Signed)
2 unsuccessful IV attempts by the RN (right forearm and right hand). Amy, primary RN, made aware with orders placed for IV team at this time.

## 2019-06-27 DIAGNOSIS — L659 Nonscarring hair loss, unspecified: Secondary | ICD-10-CM | POA: Diagnosis not present

## 2019-07-11 DIAGNOSIS — M9902 Segmental and somatic dysfunction of thoracic region: Secondary | ICD-10-CM | POA: Diagnosis not present

## 2019-07-11 DIAGNOSIS — M9901 Segmental and somatic dysfunction of cervical region: Secondary | ICD-10-CM | POA: Diagnosis not present

## 2019-07-11 DIAGNOSIS — M5384 Other specified dorsopathies, thoracic region: Secondary | ICD-10-CM | POA: Diagnosis not present

## 2019-07-11 DIAGNOSIS — M5382 Other specified dorsopathies, cervical region: Secondary | ICD-10-CM | POA: Diagnosis not present

## 2019-09-28 ENCOUNTER — Ambulatory Visit: Payer: Self-pay | Attending: Internal Medicine

## 2019-09-28 DIAGNOSIS — Z20822 Contact with and (suspected) exposure to covid-19: Secondary | ICD-10-CM

## 2019-09-28 DIAGNOSIS — Z20828 Contact with and (suspected) exposure to other viral communicable diseases: Secondary | ICD-10-CM | POA: Insufficient documentation

## 2019-09-29 LAB — NOVEL CORONAVIRUS, NAA: SARS-CoV-2, NAA: NOT DETECTED

## 2019-10-05 DIAGNOSIS — Z20828 Contact with and (suspected) exposure to other viral communicable diseases: Secondary | ICD-10-CM | POA: Diagnosis not present

## 2019-10-05 DIAGNOSIS — R509 Fever, unspecified: Secondary | ICD-10-CM | POA: Diagnosis not present

## 2019-10-05 DIAGNOSIS — R0602 Shortness of breath: Secondary | ICD-10-CM | POA: Diagnosis not present

## 2019-10-05 DIAGNOSIS — R05 Cough: Secondary | ICD-10-CM | POA: Diagnosis not present

## 2019-12-28 DIAGNOSIS — Z03818 Encounter for observation for suspected exposure to other biological agents ruled out: Secondary | ICD-10-CM | POA: Diagnosis not present

## 2019-12-28 DIAGNOSIS — Z20828 Contact with and (suspected) exposure to other viral communicable diseases: Secondary | ICD-10-CM | POA: Diagnosis not present

## 2021-03-31 IMAGING — US US ABDOMEN LIMITED
1 series · 14 of 25 positions shown · non-contrast
Comparison: None.

CLINICAL DATA: Right upper quadrant pain for several days

EXAM:
ULTRASOUND ABDOMEN LIMITED RIGHT UPPER QUADRANT

[Series 1: us abdomen limited · 14 of 48 slices shown]
[im 1/48]
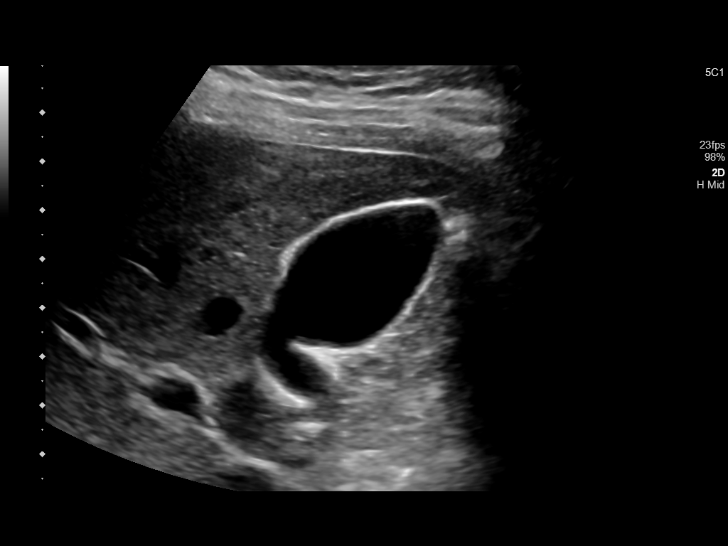
[im 4/48]
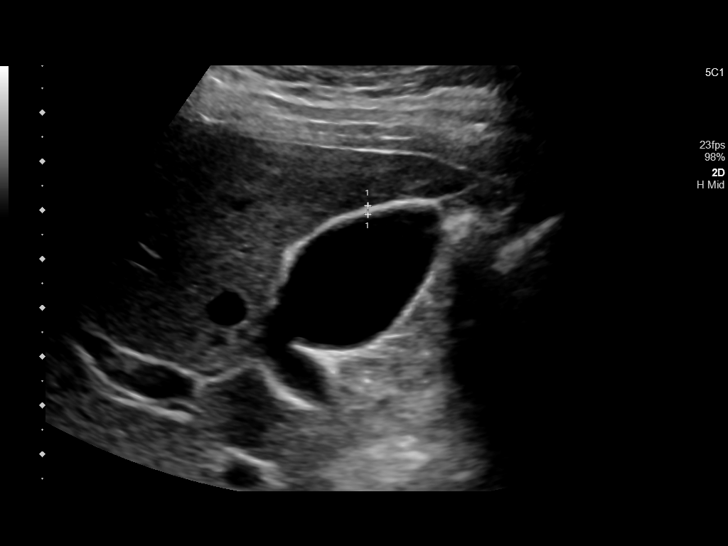
[im 8/48]
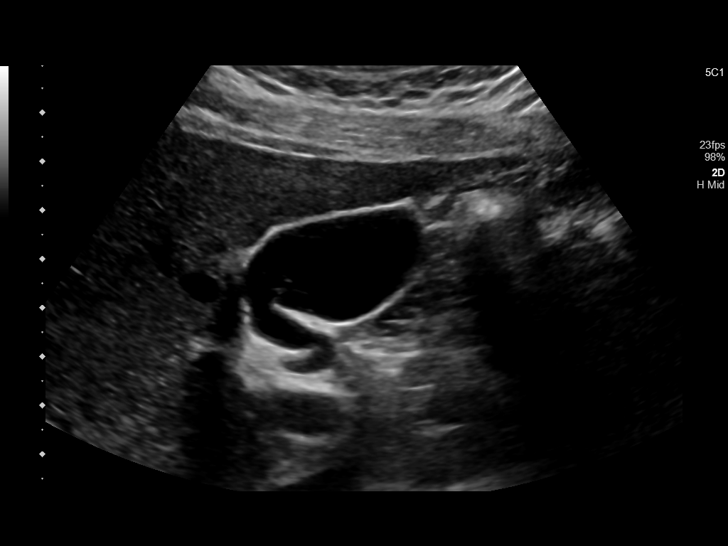
[im 12/48]
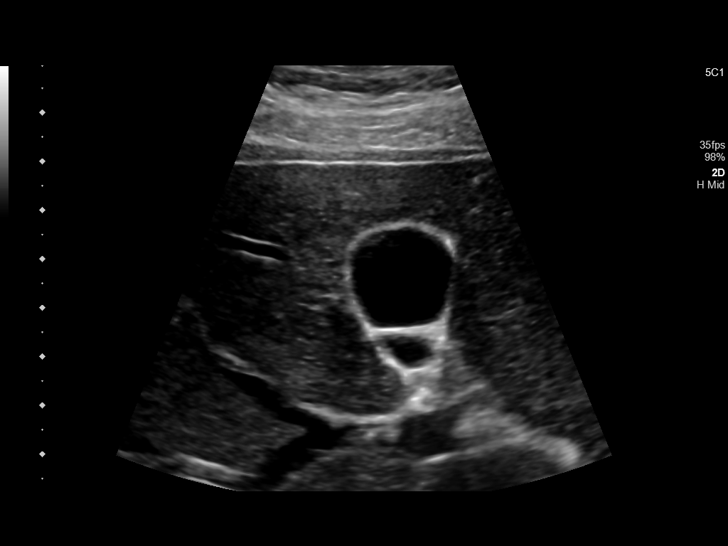
[im 16/48]
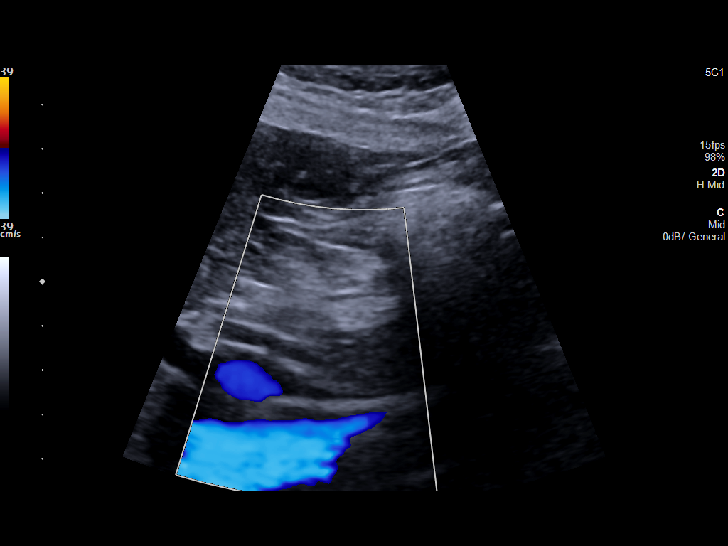
[im 18/48]
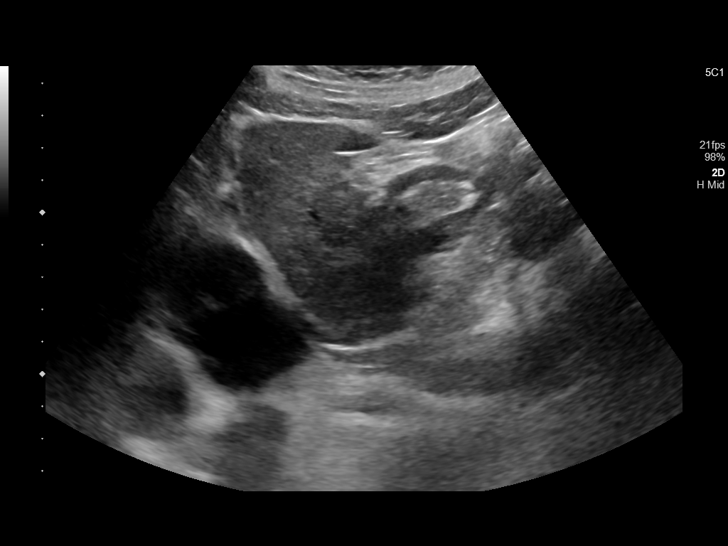
[im 22/48]
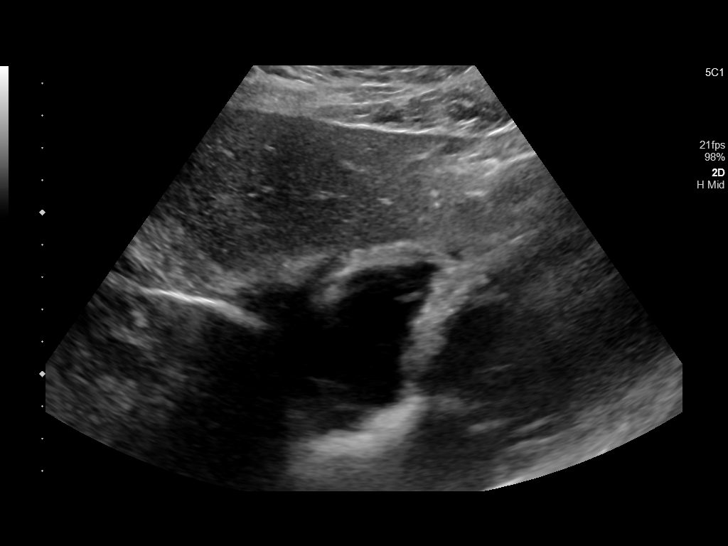
[im 26/48]
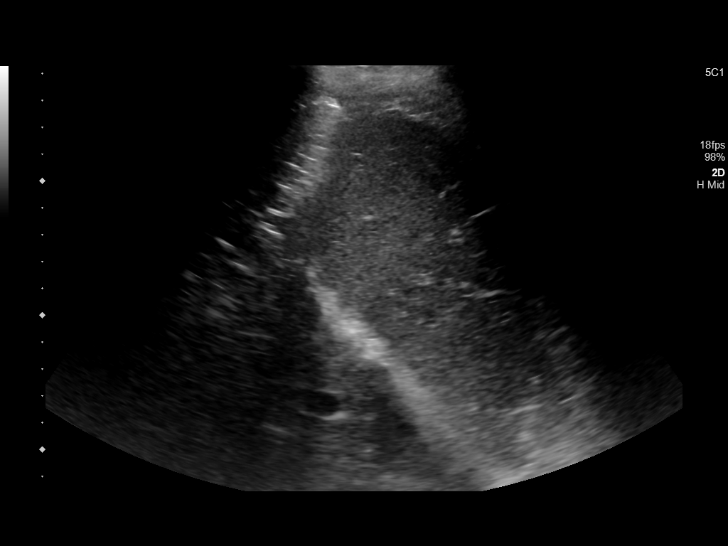
[im 30/48]
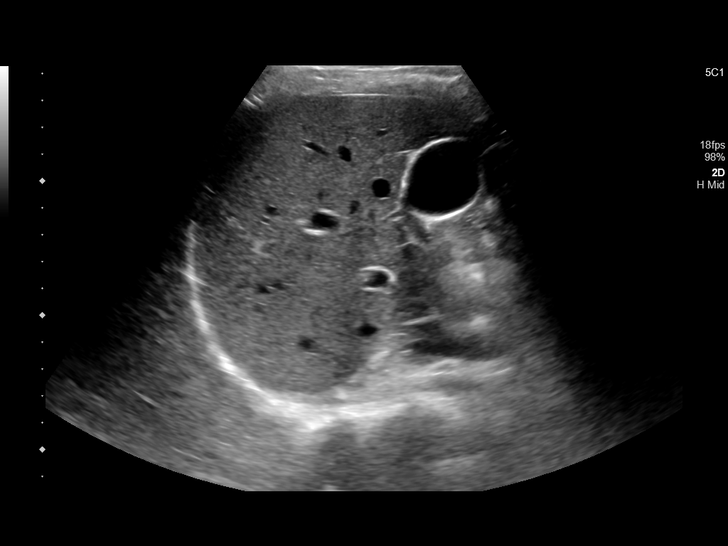
[im 32/48]
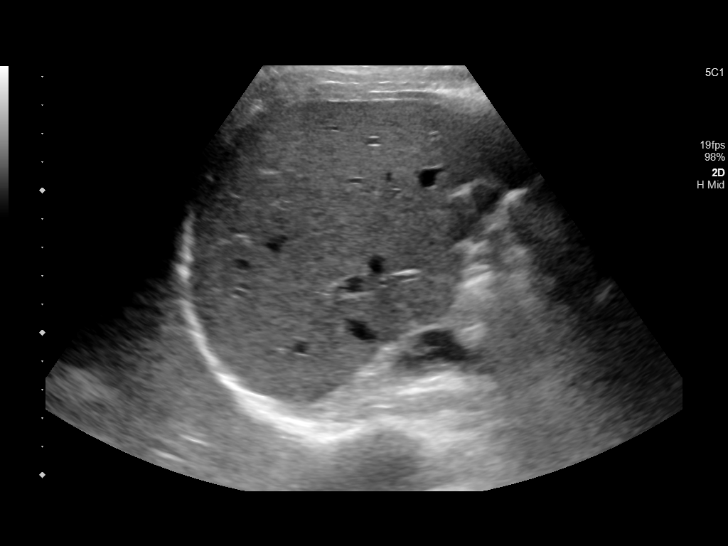
[im 36/48]
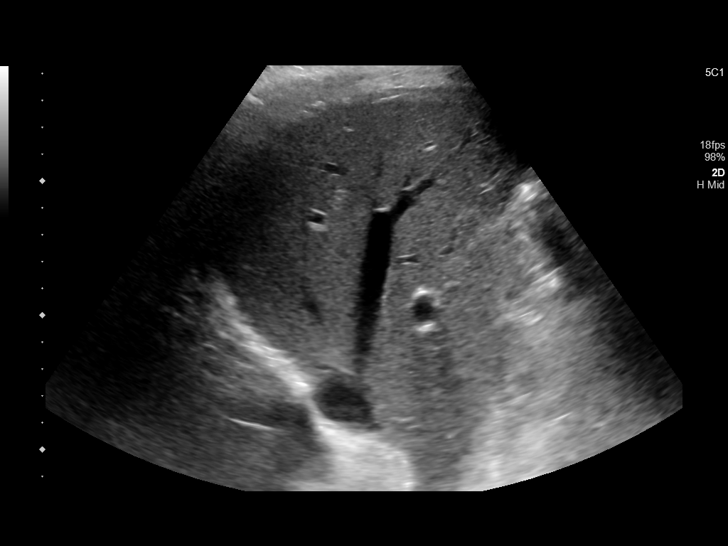
[im 40/48]
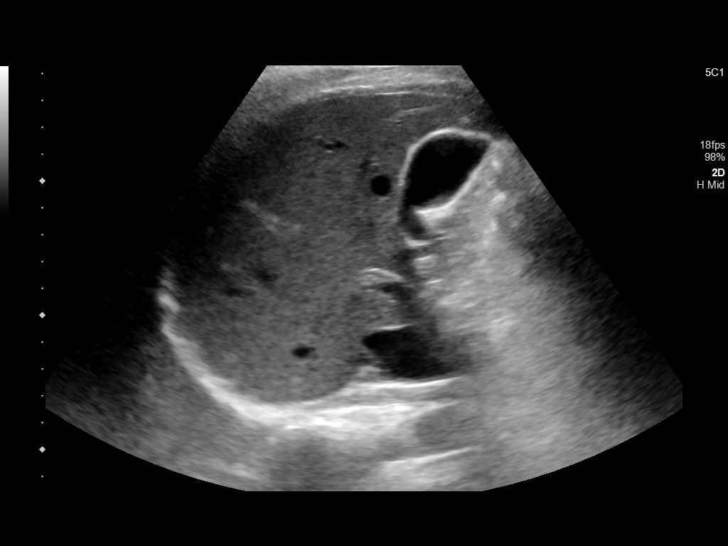
[im 44/48]
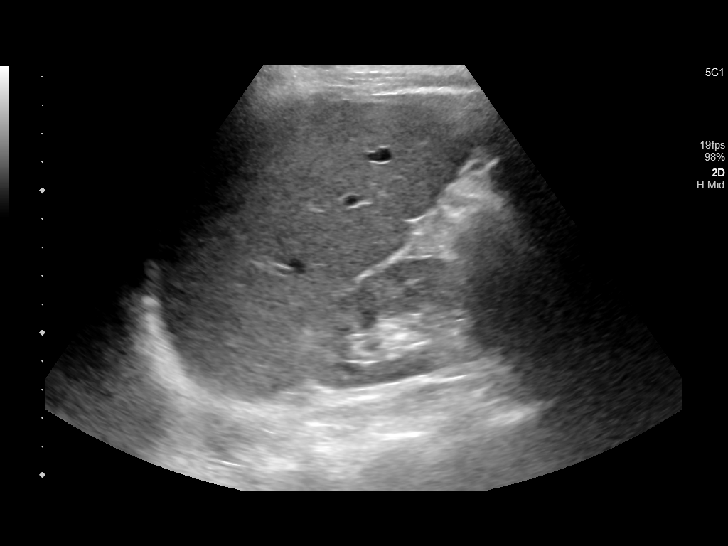
[im 48/48]
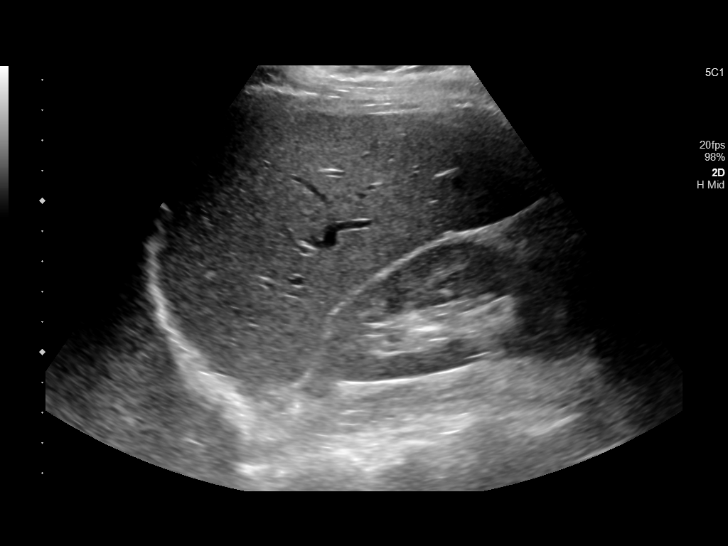

[14 of 25 positions shown; findings below may reference images not displayed]

FINDINGS: Gallbladder:

No gallstones or wall thickening visualized. No sonographic Murphy
sign noted by sonographer.

Common bile duct:

Diameter: 3.7 mm.

Liver:

No focal lesion identified. Within normal limits in parenchymal
echogenicity. Portal vein is patent on color Doppler imaging with
normal direction of blood flow towards the liver.

Other: None.
IMPRESSION: No acute abnormality noted.

## 2022-04-11 ENCOUNTER — Emergency Department: Payer: No Typology Code available for payment source

## 2022-04-11 ENCOUNTER — Emergency Department
Admission: EM | Admit: 2022-04-11 | Discharge: 2022-04-11 | Disposition: A | Discharge status: Home / Self Care | Payer: No Typology Code available for payment source | Attending: Emergency Medicine | Admitting: Emergency Medicine

## 2022-04-11 ENCOUNTER — Other Ambulatory Visit: Payer: Self-pay

## 2022-04-11 ENCOUNTER — Encounter: Payer: Self-pay | Admitting: Medical Oncology

## 2022-04-11 DIAGNOSIS — R600 Localized edema: Secondary | ICD-10-CM | POA: Insufficient documentation

## 2022-04-11 DIAGNOSIS — M79661 Pain in right lower leg: Secondary | ICD-10-CM | POA: Diagnosis present

## 2022-04-11 DIAGNOSIS — R609 Edema, unspecified: Secondary | ICD-10-CM

## 2022-04-11 DIAGNOSIS — E039 Hypothyroidism, unspecified: Secondary | ICD-10-CM | POA: Diagnosis not present

## 2022-04-11 DIAGNOSIS — M79604 Pain in right leg: Secondary | ICD-10-CM

## 2022-04-11 MED ORDER — MELOXICAM 15 MG PO TABS: 15.0000 mg | ORAL_TABLET | Freq: Every day | ORAL | 0 refills | Status: AC

## 2022-04-11 NOTE — ED Provider Notes (Signed)
[facility] Provider Note    Event Date/Time   First MD Initiated Contact with Patient 04/11/22 [phone]     (approximate)   History   Foot Swelling   HPI  Ariana Spencer is a 36 y.o. female presents to the ED with complaint of right foot swelling and edema and her lower leg with pain into her calf for the last several days.  She denies any injury to her leg, no recent extended traveling and patient is not a smoker.  No previous DVT.  Patient elevated her leg all night and states that decrease swelling today.  She has a history of myalgias, headache, herpes zoster, GERD, hypothyroidism, depression.     Physical Exam   Triage Vital Signs: ED Triage Vitals [04/11/22 0813]  Enc Vitals Group     BP 140/81     Pulse Rate 60     Resp 16     Temp 98 F (36.7 C)     Temp Source Oral     SpO2 99 %     Weight 199 lb (90.3 kg)     Height 5\' 3"  (1.6 m)     Head Circumference      Peak Flow      Pain Score 6     Pain Loc      Pain Edu?      Excl. in GC?     Most recent vital signs: Vitals:   04/11/22 0813  BP: 140/81  Pulse: 60  Resp: 16  Temp: 98 F (36.7 C)  SpO2: 99%     General: Awake, no distress.  CV:  Good peripheral perfusion.  Resp:  Normal effort.  Abd:  No distention.  Other:  Right lower extremity in comparison with the left with minimal edema at present.  Skin is intact.  No erythema or warmth.  Motor or sensory function intact.  Pulses are present.   ED Results / Procedures / Treatments   Labs (all labs ordered are listed, but only abnormal results are displayed) Labs Reviewed - No data to display    RADIOLOGY Ultrasound venous study to the right lower extremity per radiologist was negative for DVT. Right foot x-ray was negative for bony abnormality interpreted by myself independently of the radiologist.  Radiology report reviewed and agreed.   PROCEDURES:  Critical Care performed:   Procedures   MEDICATIONS  ORDERED IN ED: Medications - No data to display   IMPRESSION / MDM / ASSESSMENT AND PLAN / ED COURSE  I reviewed the triage vital signs and the nursing notes.   Differential diagnosis includes, but is not limited to, right lower extremity pain, DVT, muscle strain, edema lower extremity.  36 year old female presents to the ED with complaint of right lower extremity pain and edema.  Patient is unaware of any known injury.  She states initially it started with her right foot and has progressed until the right calf area is also swelling and tender.  Venous ultrasound was negative for DVT and foot x-ray was negative for acute bony injury.  Patient was made aware and will try an anti-inflammatory and follow-up with her PCP.  We also discussed compression socks to help with swelling and elevation.  She is to return to the emergency department over the weekend if any severe worsening of her symptoms.      Patient's presentation is most consistent with acute complicated illness / injury requiring diagnostic workup.  FINAL CLINICAL IMPRESSION(S) / ED  DIAGNOSES   Final diagnoses:  Acute leg pain, right  Edema, unspecified type     Rx / DC Orders   ED Discharge Orders          Ordered    meloxicam (MOBIC) 15 MG tablet  Daily        04/11/22 1116             Note:  This document was prepared using Dragon voice recognition software and may include unintentional dictation errors.   Tommi Rumps, PA-C 04/11/22 1600    Sharman Cheek, MD 04/14/22 0002

## 2022-04-11 NOTE — ED Triage Notes (Signed)
Pt reports for a few days she has been having swelling to rt foot and now the swelling has began to increase to rt leg and has pain in calf. Pt denies injury.

## 2022-04-11 NOTE — ED Notes (Signed)
Patient declined discharge vital signs. 

## 2022-04-11 NOTE — Discharge Instructions (Addendum)
Follow-up with your primary care provider if any continued problems or concerns.  Elevate legs often as needed for swelling.  Begin taking the anti-inflammatory daily with food.  You may also try compression socks to see if this helps with any swelling.  It may be necessary for more testing and this can be done outpatient with your primary care provider.

## 2023-09-29 ENCOUNTER — Ambulatory Visit (HOSPITAL_COMMUNITY)
Admission: EM | Admit: 2023-09-29 | Discharge: 2023-09-29 | Disposition: A | Discharge status: Home / Self Care | Payer: No Typology Code available for payment source | Attending: Psychiatry | Admitting: Psychiatry

## 2023-09-29 DIAGNOSIS — F411 Generalized anxiety disorder: Secondary | ICD-10-CM | POA: Diagnosis not present

## 2023-09-29 DIAGNOSIS — F331 Major depressive disorder, recurrent, moderate: Secondary | ICD-10-CM

## 2023-09-29 MED ORDER — SERTRALINE HCL 25 MG PO TABS: 25.0000 mg | ORAL_TABLET | Freq: Every day | ORAL | 0 refills | Status: AC

## 2023-09-29 MED ORDER — HYDROXYZINE HCL 25 MG PO TABS: 25.0000 mg | ORAL_TABLET | Freq: Three times a day (TID) | ORAL | 0 refills | Status: AC | PRN

## 2023-09-29 NOTE — ED Provider Notes (Signed)
 [facility] Medical Screening Exam  Patient Name: Ariana Spencer MRN: [id number] Date of Evaluation: 09/29/23 Chief Complaint:  Increased anxiety, panic attack, and depression Diagnosis:  Final diagnoses:  None    History of Present illness: Ariana Spencer is a 37 y.o. female patient presented to [facility] as a walk in unaccompanied with complaints of increased anxiety, panic attack, and depression  Ariana Spencer, 37 y.o., female patient seen face to face by this provider, chart reviewed on 09/29/23.  Per chart review patient has past psychiatric history of MDD and anxiety.  She has a history of 2 inpatient psychiatric admissions, last admission 2018.  She was discharged with Zoloft  25 mg daily and hydroxyzine  25 mg 3 times daily as needed at that time.  She has not taken this medication since 2018.  She denies any history of SI attempt.  Patient's mother and stepfather both completed suicide when she was a young child.  She has no psychiatric services in place.  She lives in the home with her 2 children ages 52 and 51.  She works full-time.  She denies any substance use.  On evaluation Ariana Spencer reports she has struggled with depression throughout her life.  She has noticed a progressive increase in her depression anxiety over the past year.  However over the past 2 weeks she has noticed a significant decline in her mental health.  She endorses depression with feelings of guilt, tearfulness, irritability, decreased sleep and increased appetite.  She is sleeping 2-3 hours per night she has a depressed affect.  She identifies going through a custody battle with her ex throughout the past year as her biggest administrator, sports.  In addition this is the first holiday she has had full custody of both of her children and has been difficulty as she has tried to make sure they had everything they need.  Earlier today she tried to call her primary care physician to be restarted on  medications to help with her depression and anxiety.  However her doctor is at office for the rest of the week.  She presents today requesting to be started on medications to help with her anxiety and depression.  During evaluation Ariana Spencer is observed sitting in assessment room in no acute distress.  She is well-groomed and makes good eye contact.  She is alert/oriented x 4, cooperative, and attentive.  She has normal speech and behavior she is tearful throughout assessment.  She denies SI/HI/AVH.  She verbally contracts for safety.  She denies access to firearms/weapons.  She had advised that her children are to her protective factors.  She does not appear paranoid, psychotic, or manic.  She does not appear to be responding to internal/external stimuli.  Discussed starting Zoloft  25 mg daily for depression and hydroxyzine  25 mg 3 times daily as needed for anxiety.  Explained medications including any adverse reactions.  Discussed black box warning of increased suicidal ideations.  Patient verbalizes understanding and is in agreement to take medications.  Printed prescriptions will be provided along with outpatient psychiatric resources for medication management and therapy.  Patient declined PHP program referral due to her current employment hours and she does not like group setting for therapy.  At this time Ariana Spencer is educated and verbalizes understanding of mental health resources and other crisis services in the community.  She is instructed to call 911 and present to the nearest emergency room should she experience any suicidal/homicidal ideation,  auditory/visual/hallucinations, or detrimental worsening of her mental health condition.  She was a also advised by clinical research associate that she he/she could call the toll-free phone on back of  insurance card to assist with identifying counselors and agencies in network.      Flowsheet Row ED from 09/29/2023 in [facility] ED from 04/11/2022 in [facility] Emergency Department at [facility]  C-SSRS RISK CATEGORY No Risk No Risk       Psychiatric Specialty Exam  Presentation  General Appearance:Appropriate for Environment; Casual  Eye Contact:Good  Speech:Clear and Coherent; Normal Rate  Speech Volume:Normal  Handedness:Right   Mood and Affect  Mood: Anxious; Depressed  Affect: Tearful   Thought Process  Thought Processes: Coherent  Descriptions of Associations:Intact  Orientation:Full (Time, Place and Person)  Thought Content:Logical    Hallucinations:None  Ideas of Reference:None  Suicidal Thoughts:No  Homicidal Thoughts:No   Sensorium  Memory: Immediate Good; Recent Good; Remote Good  Judgment: Good  Insight: Good   Executive Functions  Concentration: Good  Attention Span: Good  Recall: Good  Fund of Knowledge: Good  Language: Good   Psychomotor Activity  Psychomotor Activity: Normal   Assets  Assets: Communication Skills; Desire for Improvement; Financial Resources/Insurance; Physical Health; Resilience; Social Support; Housing   Sleep  Sleep:No data recorded Number of hours:  3   Physical Exam: Physical Exam Vitals and nursing note reviewed.  Constitutional:      General: She is not in acute distress.    Appearance: Normal appearance. She is not ill-appearing.  Eyes:     General:        Right eye: No discharge.        Left eye: No discharge.  Cardiovascular:     Rate and Rhythm: Normal rate.  Pulmonary:     Effort: Pulmonary effort is normal. No respiratory distress.  Musculoskeletal:        General: Normal range of motion.     Cervical back: Normal range of motion.  Skin:    Coloration: Skin is not jaundiced or pale.  Neurological:     Mental Status: She is alert and oriented to person, place, and time.  Psychiatric:        Attention and Perception: Attention and perception normal.        Mood and Affect:  Mood is anxious and depressed. Affect is tearful.        Speech: Speech normal.        Behavior: Behavior is cooperative.        Thought Content: Thought content normal.        Cognition and Memory: Cognition normal.        Judgment: Judgment normal.    Review of Systems  Constitutional: Negative.   HENT: Negative.    Eyes: Negative.   Respiratory: Negative.    Cardiovascular: Negative.   Musculoskeletal: Negative.   Skin: Negative.   Neurological: Negative.   Psychiatric/Behavioral:  Positive for depression. The patient is nervous/anxious.    Blood pressure (!) 134/96, pulse 69, temperature 98.8 F (37.1 C), temperature source Oral, resp. rate 16, SpO2 100%, currently breastfeeding. There is no height or weight on file to calculate BMI.  Musculoskeletal: Strength & Muscle Tone: within normal limits Gait & Station: normal Patient leans: N/A   [facility] MSE Discharge Disposition for Follow up and Recommendations: Based on my evaluation the patient does not appear to have an emergency medical condition and can be discharged with resources and follow up  care in outpatient services for Medication Management and Individual Therapy  Discharge patient  Provided printed prescription for Zoloft  25 mg daily for depression and hydroxyzine  25 mg 3 times daily as needed for anxiety.   Provided outpatient psychiatric resources for medication management and therapy.   Elveria VEAR Batter, NP 09/29/2023, 11:56 AM

## 2023-09-29 NOTE — Discharge Instructions (Signed)
 Discharge recommendations:   Medications: Patient is to take medications as prescribed. The patient or patient's guardian is to contact a medical professional and/or outpatient provider to address any new side effects that develop. The patient or the patient's guardian should update outpatient providers of any new medications and/or medication changes.    Outpatient Follow up: Please review list of outpatient resources for psychiatry and counseling. Please follow up with your primary care provider for all medical related needs.    Therapy: We recommend that patient participate in individual therapy to address mental health concerns.   Atypical antipsychotics: If you are prescribed an atypical antipsychotic, it is recommended that your height, weight, BMI, blood pressure, fasting lipid panel, and fasting blood sugar be monitored by your outpatient providers.  Safety:   The following safety precautions should be taken:   No sharp objects. This includes scissors, razors, scrapers, and putty knives.   Chemicals should be removed and locked up.   Medications should be removed and locked up.   Weapons should be removed and locked up. This includes firearms, knives and instruments that can be used to cause injury.   The patient should abstain from use of illicit substances/drugs and abuse of any medications.  If symptoms worsen or do not continue to improve or if the patient becomes actively suicidal or homicidal then it is recommended that the patient return to the closest hospital emergency department, the [facility], or call 911 for further evaluation and treatment. National Suicide Prevention Lifeline 1-800-SUICIDE or (581)424-7081.  About 988 988 offers 24/7 access to trained crisis counselors who can help people experiencing mental health-related distress. People can call or text 988 or chat 988lifeline.org for themselves or if they are worried about a  loved one who may need crisis support.    It is imperative that you follow through with treatment recommendations within 5-7 days from the day of discharge to mitigate further risk to your safety and overall mental well-being.  A list of outpatient therapy and psychiatric providers for medication management has been provided below to get you started in finding the right provider for you.            [facility] [address] [place], [place], [address] [phone] phone (Medicare, Private insurance except Tricare, [place], and [facility])  [facility] [address] [place], [place], [address] [address] phone ([address], [employer] [facility], [address], [place], [place], Friday Health Plans, [address], BCBS Healthy [place], [place], [address], [place], [place], [place], [place], Tricare, [facility], [employer], Eli Lilly and Company)  [employer] [phone] [address] [place], [place], [address] [phone] phone [phone] phone [phone] fax  [facility] [address] [place], [place], [address] [phone] phone (Call to confirm insurance coverage) Consultation & Support Services     o Drop-In Hours: 1:00 PM to 5:00 PM     o Days: Monday - Thursday  Crisis Services (24/7)   [facility] [address] [place], [place], [address] [phone] phone ([address] [place], [place], [place] Medicaid, [place] and [place], [facility])      [facility] [address] [place], [place], [address] [phone] phone [email or web address]  (to complete the intake form and upload ID and insurance cards)  [facility] [address] [place], [place], [address] [phone] phone ([address], [address], [place], [address] Complete [place], [place], [place],  Optum, UHC, Eli Lilly and Company, and certain  Medicaid plans)  [facility] [phone] [address] [place], [place], [address] [phone] phone [phone] fax (Medicaid, Medicare, Self-pay, call about other insurance coverage)  [facility] (age 75+) [address] [place], [place], [address] [phone] hone [phone] fax ([place], [address], [place], [place], [place], [address], [place], [place], [place], [place], certain [employer], [facility], UMR)  [facility] [employer] [address], [place], [address] [phone] phone (Medicare, Medicaid, Artemio Aly, call about other insurance coverage)  [facility] [address] [place], [place], [address] [phone] phone [phone] fax (Call [phone] to see what insurance is accepted) Archer Asa, MD specializes in geropsych)  [facility]  (medication management only) [address] [place], [place], [address] [phone] phone [phone] fax ([address], Medicaid, [place], [place], [place], [place], [place], [place], [place])  [facility] (medication management only) [address] [place], [place], [address] [phone] phone [phone] fax ([address] Medicare, [place], [place], Tricare [place])  [facility] [address] [place], [place], [address] [phone] phone [phone] fax ([address] [place], [place], [place], [place], [facility], [facility] Medicaid/[place] Health Choice)  [facility] [place], [employer]. [address] [place], [place], [address] [phone] phone [phone] fax (Medicare, Medicaid, [facility])  [facility] [address] [place], [place] [address] [phone] phone ([address], [place], [place], [place], [place], Medicare, [place], [facility]) Does genetic testing for medications; does transcranial magnetic stimulation along with basic services)  [facility] [address] [place], [place], [address] [phone] phone (Call about insurance coverage)  [facility] [address] [place], [place], [address] [phone] phone [phone] fax (Call about insurance coverage)  [facility] [address] [place], [place], [address] [phone] phone [phone] fax (Call about insurance coverage)  [employer] [phone] [address], [place], [address] [phone] phone (Medicaid, Tricare, [place], [place], [place])  [employer] [address] [place], [place], [address] [phone] phone (Medicaid, Medicare, call about other insurance coverage)  [facility] [address] [place], [place], [address] [phone] phone [phone] fax (Medicaid, Medicare, Tricare, call about other insurance coverage)  [facility] [address] [place], [address] [phone] phone ([address], [place], [place], [place], [place] types - [place], Secretary/administrator, Partners, [place], [place] Health Choice, Healthy [place], [place], [place], and Complete)  [facility] [address] [place], [place], [address] [phone] phone Completely online treatment platform Contact: Personal assistant - [employer] Specialist [phone] phone [phone] fax ([address] [place], [place], Friday Health Plan, [place], [place], [place], [place], [place], [place])
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
